# Patient Record
Sex: Male | Born: 1975 | Race: White | Hispanic: No | Marital: Single | Smoking: Never smoker
Health system: Southern US, Community
[De-identification: ages and names within clinical notes are randomized; demographics above are authoritative.]

## PROBLEM LIST (undated history)

## (undated) DIAGNOSIS — K5792 Diverticulitis of intestine, part unspecified, without perforation or abscess without bleeding: Secondary | ICD-10-CM

---

## 2017-05-06 ENCOUNTER — Other Ambulatory Visit: Payer: Self-pay

## 2017-05-06 ENCOUNTER — Emergency Department (HOSPITAL_COMMUNITY)
Admission: EM | Admit: 2017-05-06 | Discharge: 2017-05-07 | Disposition: A | Discharge status: Home / Self Care | Payer: Self-pay | Attending: Emergency Medicine | Admitting: Emergency Medicine

## 2017-05-06 ENCOUNTER — Encounter (HOSPITAL_COMMUNITY): Payer: Self-pay | Admitting: Emergency Medicine

## 2017-05-06 DIAGNOSIS — F419 Anxiety disorder, unspecified: Secondary | ICD-10-CM | POA: Insufficient documentation

## 2017-05-06 DIAGNOSIS — Z760 Encounter for issue of repeat prescription: Secondary | ICD-10-CM | POA: Insufficient documentation

## 2017-05-06 HISTORY — DX: Diverticulitis of intestine, part unspecified, without perforation or abscess without bleeding: K57.92

## 2017-05-06 NOTE — ED Notes (Signed)
Patient states he is feeling nauseated and anxious.

## 2017-05-06 NOTE — ED Triage Notes (Signed)
Pt states [place] recently [place] here from [place] and has not found a dr yet and has run out of his Ativan  Pt states he has been on it since 2006  Pt states he is having some chest tightness and does not feel well  Pt states he needs his ativan filled and needs a referral to a dr

## 2017-05-07 MED ORDER — LORAZEPAM 1 MG PO TABS: 1.0000 mg | ORAL_TABLET | Freq: Every day | ORAL | 0 refills | Status: DC | PRN

## 2017-05-07 NOTE — ED Provider Notes (Signed)
[facility]-EMERGENCY DEPT Provider Note   CSN: [id number] Arrival date & time: 05/06/17  2013     History   Chief Complaint Chief Complaint  Patient presents with  . Anxiety    HPI Thomas Fitzpatrick is a 42 y.o. male.  Patient presents with symptoms of intermittent heart palpitations, and anxiousness. He has been on Ativan regularly for anxiety for years and recently moved into the area without having established with a physician. He ran out of his medication 2-3 days ago. He feels nervous but denies nausea, vomiting. No HI/SI/AVH. He is requesting a medication refill.   The history is provided by the patient. No language interpreter was used.  Anxiety  Pertinent negatives include no shortness of breath.    Past Medical History:  Diagnosis Date  . Diverticulitis     There are no active problems to display for this patient.   History reviewed. No pertinent surgical history.     Home Medications    Prior to Admission medications   Not on File    Family History Family History  Problem Relation Age of Onset  . CAD Other     Social History Social History   Tobacco Use  . Smoking status: Never Smoker  . Smokeless tobacco: Never Used  Substance Use Topics  . Alcohol use: Yes    Comment: rare  . Drug use: No     Allergies   Penicillins   Review of Systems Review of Systems  Constitutional: Negative for chills and fever.  Respiratory: Negative.  Negative for cough and shortness of breath.   Cardiovascular: Positive for palpitations.  Gastrointestinal: Negative.  Negative for nausea and vomiting.  Musculoskeletal: Negative.   Skin: Negative.   Neurological: Negative.   Psychiatric/Behavioral: The patient is nervous/anxious.      Physical Exam Updated Vital Signs BP (!) 164/128 (BP Location: Right Arm)   Pulse (!) 111   Temp 97.8 F (36.6 C) (Oral)   Resp 18   Ht 6\' 2"  (1.88 m)   Wt 106.6 kg (235 lb)   SpO2 99%   BMI  30.17 kg/m   Physical Exam  Constitutional: He is oriented to person, place, and time. He appears well-developed and well-nourished.  HENT:  Head: Normocephalic.  Neck: Normal range of motion. Neck supple.  Cardiovascular: Normal rate and regular rhythm.  Pulmonary/Chest: Effort normal and breath sounds normal.  Abdominal: Soft. Bowel sounds are normal. There is no tenderness. There is no rebound and no guarding.  Musculoskeletal: Normal range of motion.  Neurological: He is alert and oriented to person, place, and time.  Skin: Skin is warm and dry. No rash noted.  Psychiatric: He has a normal mood and affect.     ED Treatments / Results  Labs (all labs ordered are listed, but only abnormal results are displayed) Labs Reviewed - No data to display  EKG  EKG Interpretation None       Radiology No results found.  Procedures Procedures (including critical care time)  Medications Ordered in ED Medications - No data to display   Initial Impression / Assessment and Plan / ED Course  I have reviewed the triage vital signs and the nursing notes.  Pertinent labs & imaging results that were available during my care of the patient were reviewed by me and considered in my medical decision making (see chart for details).     Patient here with request for medication refill. He has confirmation of prescription and  appears to take the meds as prescribed without overuse.   He is tachycardic raising concern for onset of withdrawal as he has been on Ativan for 'years' and has been out for 2-3 days. His current use is 1-2 pills (1 mg) daily prn.   Will Rx #10 only. He reports his insurance is now active and is seeking PCP for further management.    Final Clinical Impressions(s) / ED Diagnoses   Final diagnoses:  None   1. Anxiety 2. Medication refill  ED Discharge Orders    None       Elpidio Anis, PA-C 05/07/17 0018    Ward, Layla Maw, DO 05/07/17 [phone]

## 2017-05-07 NOTE — Discharge Instructions (Signed)
Follow up with a primary care provider for further management of anxiety.

## 2017-05-28 ENCOUNTER — Emergency Department (HOSPITAL_BASED_OUTPATIENT_CLINIC_OR_DEPARTMENT_OTHER): Payer: BLUE CROSS/BLUE SHIELD

## 2017-05-28 ENCOUNTER — Emergency Department (HOSPITAL_BASED_OUTPATIENT_CLINIC_OR_DEPARTMENT_OTHER)
Admission: EM | Admit: 2017-05-28 | Discharge: 2017-05-28 | Disposition: A | Discharge status: Home / Self Care | Payer: BLUE CROSS/BLUE SHIELD | Attending: Emergency Medicine | Admitting: Emergency Medicine

## 2017-05-28 ENCOUNTER — Other Ambulatory Visit: Payer: Self-pay

## 2017-05-28 ENCOUNTER — Encounter (HOSPITAL_BASED_OUTPATIENT_CLINIC_OR_DEPARTMENT_OTHER): Payer: Self-pay

## 2017-05-28 DIAGNOSIS — Y999 Unspecified external cause status: Secondary | ICD-10-CM | POA: Diagnosis not present

## 2017-05-28 DIAGNOSIS — Z79899 Other long term (current) drug therapy: Secondary | ICD-10-CM | POA: Diagnosis not present

## 2017-05-28 DIAGNOSIS — Y929 Unspecified place or not applicable: Secondary | ICD-10-CM | POA: Insufficient documentation

## 2017-05-28 DIAGNOSIS — Z23 Encounter for immunization: Secondary | ICD-10-CM | POA: Diagnosis not present

## 2017-05-28 DIAGNOSIS — S91312A Laceration without foreign body, left foot, initial encounter: Secondary | ICD-10-CM | POA: Diagnosis not present

## 2017-05-28 DIAGNOSIS — Y939 Activity, unspecified: Secondary | ICD-10-CM | POA: Diagnosis not present

## 2017-05-28 DIAGNOSIS — W25XXXA Contact with sharp glass, initial encounter: Secondary | ICD-10-CM | POA: Diagnosis not present

## 2017-05-28 MED ORDER — CEPHALEXIN 500 MG PO CAPS: 500.0000 mg | ORAL_CAPSULE | Freq: Three times a day (TID) | ORAL | 0 refills | Status: AC

## 2017-05-28 MED ORDER — CEPHALEXIN 250 MG PO CAPS
500.0000 mg | ORAL_CAPSULE | Freq: Once | ORAL | Status: AC
  Administered 2017-05-28: 500 mg via ORAL
  Filled 2017-05-28: qty 2

## 2017-05-28 MED ORDER — TETANUS-DIPHTH-ACELL PERTUSSIS 5-2.5-18.5 LF-MCG/0.5 IM SUSP
0.5000 mL | Freq: Once | INTRAMUSCULAR | Status: AC
  Administered 2017-05-28: 0.5 mL via INTRAMUSCULAR
  Filled 2017-05-28: qty 0.5

## 2017-05-28 NOTE — ED Provider Notes (Signed)
[facility] EMERGENCY DEPARTMENT Provider Note  CSN: [id number] Arrival date & time: 05/28/17 [id number]  Chief Complaint(s) Foot Injury  HPI Thomas Fitzpatrick is a 42 y.o. male with left foot pain after stepping on broken glass last night.  Patient states that he remove the shard of glass and was able to control the bleeding.  He did not washout the wound.  That he is endorsing persistent pain in that region.  Denies any falls or other trauma.  Denies any other injuries.  Unsure of last tetanus.  HPI  Past Medical History Past Medical History:  Diagnosis Date  . Diverticulitis    There are no active problems to display for this patient.  Home Medication(s) Prior to Admission medications   Medication Sig Start Date End Date Taking? Authorizing Provider  Amphetamine-Dextroamphetamine (ADDERALL PO) Take by mouth.   Yes [provider]  cephALEXin (KEFLEX) 500 MG capsule Take 1 capsule (500 mg total) by mouth 3 (three) times daily for 5 days. 05/28/17 06/02/17  Nira Connardama, Chales Pelissier Eduardo, MD  LORazepam (ATIVAN) 1 MG tablet Take 1 tablet (1 mg total) by mouth daily as needed for anxiety. 05/07/17   Elpidio AnisUpstill, Shari, PA-C                                                                                                                                    Past Surgical History History reviewed. No pertinent surgical history. Family History Family History  Problem Relation Age of Onset  . CAD Other     Social History Social History   Tobacco Use  . Smoking status: Never Smoker  . Smokeless tobacco: Never Used  Substance Use Topics  . Alcohol use: Yes    Comment: rare  . Drug use: No   Allergies Penicillins  Review of Systems Review of Systems  Skin: Positive for wound.    Physical Exam Vital Signs  I have reviewed the triage vital signs BP (!) 131/106 (BP Location: Left Arm)   Pulse (!) 127   Temp 98.7 F (37.1 C) (Oral)   Resp 18   Ht 6\' 2"  (1.88 m)   Wt 111 kg  (244 lb 11.4 oz)   SpO2 100%   BMI 31.42 kg/m   Physical Exam  Constitutional: He is oriented to person, place, and time. He appears well-developed and well-nourished. No distress.  HENT:  Head: Normocephalic and atraumatic.  Right Ear: External ear normal.  Left Ear: External ear normal.  Nose: Nose normal.  Mouth/Throat: Mucous membranes are normal. No trismus in the jaw.  Eyes: Conjunctivae and EOM are normal. No scleral icterus.  Neck: Normal range of motion and phonation normal.  Cardiovascular: Normal rate and regular rhythm.  Pulmonary/Chest: Effort normal. No stridor. No respiratory distress.  Abdominal: He exhibits no distension.  Musculoskeletal: Normal range of motion. He exhibits no edema.       Feet:  Neurological: He is  alert and oriented to person, place, and time.  Skin: He is not diaphoretic.  Psychiatric: He has a normal mood and affect. His behavior is normal.  Vitals reviewed.   ED Results and Treatments Labs (all labs ordered are listed, but only abnormal results are displayed) Labs Reviewed - No data to display                                                                                                                       EKG  EKG Interpretation  Date/Time:    Ventricular Rate:    PR Interval:    QRS Duration:   QT Interval:    QTC Calculation:   R Axis:     Text Interpretation:        Radiology Dg Foot Complete Left  Result Date: 05/28/2017 CLINICAL DATA:  Stepped on piece of glass yesterday. Plantar foot pain. Initial encounter. EXAM: LEFT FOOT - COMPLETE 3+ VIEW COMPARISON:  None. FINDINGS: There is no evidence of fracture or dislocation. There is no evidence of arthropathy or other focal bone abnormality. Soft tissues are unremarkable. No radiopaque foreign body identified. IMPRESSION: Negative. Electronically Signed   By: Myles Rosenthal M.D.   On: 05/28/2017 19:50   Pertinent labs & imaging results that were available during my care of  the patient were reviewed by me and considered in my medical decision making (see chart for details).  Medications Ordered in ED Medications  Tdap (BOOSTRIX) injection 0.5 mL (0.5 mLs Intramuscular Given 05/28/17 2032)  cephALEXin (KEFLEX) capsule 500 mg (500 mg Oral Given 05/28/17 2031)                                                                                                                                    Procedures Procedures  (including critical care time)  Medical Decision Making / ED Course I have reviewed the nursing notes for this encounter and the patient's prior records (if available in EHR or on provided paperwork).    Patient was able to provide a picture of the glass shard which appear to be completely intact.  Plain film without evidence of foreign body.  Wound was irrigated.  Tetanus was updated.  Will allow for secondary closure given the timeframe of the wound.  Will also provide the patient with prophylactic antibiotics.  Final Clinical Impression(s) / ED Diagnoses Final diagnoses:  Laceration of left foot, initial encounter  Disposition: Discharge  Condition: Good  I have discussed the results, Dx and Tx plan with the patient who expressed understanding and agree(s) with the plan. Discharge instructions discussed at great length. The patient was given strict return precautions who verbalized understanding of the instructions. No further questions at time of discharge.    ED Discharge Orders        Ordered    cephALEXin (KEFLEX) 500 MG capsule  3 times daily     05/28/17 2036       Follow Up: Primary care provider   If you do not have a primary care physician, contact [facility] at [phone] for referral     This chart was dictated using voice recognition software.  Despite best efforts to proofread,  errors can occur which can change the documentation meaning.   Nira Conn, MD 05/28/17 2037

## 2017-05-28 NOTE — ED Triage Notes (Addendum)
Pt states he stepped on broken glass last night-lac noted to bottom of left foot-NAD-steady gait

## 2017-06-27 ENCOUNTER — Other Ambulatory Visit: Payer: Self-pay

## 2017-06-27 ENCOUNTER — Emergency Department (HOSPITAL_COMMUNITY): Payer: BLUE CROSS/BLUE SHIELD

## 2017-06-27 ENCOUNTER — Observation Stay (HOSPITAL_COMMUNITY)
Admission: EM | Admit: 2017-06-27 | Discharge: 2017-06-28 | DRG: 897 | Disposition: A | Discharge status: Home / Self Care | Payer: BLUE CROSS/BLUE SHIELD | Attending: Family Medicine | Admitting: Family Medicine

## 2017-06-27 ENCOUNTER — Encounter (HOSPITAL_COMMUNITY): Payer: Self-pay

## 2017-06-27 DIAGNOSIS — K219 Gastro-esophageal reflux disease without esophagitis: Secondary | ICD-10-CM | POA: Diagnosis not present

## 2017-06-27 DIAGNOSIS — Z880 Allergy status to penicillin: Secondary | ICD-10-CM | POA: Diagnosis not present

## 2017-06-27 DIAGNOSIS — F10231 Alcohol dependence with withdrawal delirium: Secondary | ICD-10-CM | POA: Diagnosis present

## 2017-06-27 DIAGNOSIS — F10280 Alcohol dependence with alcohol-induced anxiety disorder: Secondary | ICD-10-CM | POA: Diagnosis not present

## 2017-06-27 DIAGNOSIS — M549 Dorsalgia, unspecified: Secondary | ICD-10-CM | POA: Diagnosis present

## 2017-06-27 DIAGNOSIS — F10239 Alcohol dependence with withdrawal, unspecified: Secondary | ICD-10-CM | POA: Diagnosis not present

## 2017-06-27 DIAGNOSIS — F10931 Alcohol use, unspecified with withdrawal delirium: Secondary | ICD-10-CM | POA: Diagnosis present

## 2017-06-27 DIAGNOSIS — M545 Low back pain: Secondary | ICD-10-CM | POA: Diagnosis not present

## 2017-06-27 DIAGNOSIS — F909 Attention-deficit hyperactivity disorder, unspecified type: Secondary | ICD-10-CM | POA: Diagnosis present

## 2017-06-27 DIAGNOSIS — I10 Essential (primary) hypertension: Secondary | ICD-10-CM | POA: Diagnosis not present

## 2017-06-27 DIAGNOSIS — R0789 Other chest pain: Secondary | ICD-10-CM | POA: Diagnosis present

## 2017-06-27 DIAGNOSIS — F10180 Alcohol abuse with alcohol-induced anxiety disorder: Secondary | ICD-10-CM

## 2017-06-27 DIAGNOSIS — R079 Chest pain, unspecified: Secondary | ICD-10-CM | POA: Diagnosis present

## 2017-06-27 DIAGNOSIS — G8929 Other chronic pain: Secondary | ICD-10-CM | POA: Diagnosis not present

## 2017-06-27 DIAGNOSIS — F10939 Alcohol use, unspecified with withdrawal, unspecified: Secondary | ICD-10-CM | POA: Diagnosis present

## 2017-06-27 LAB — CBC
HCT: 49.5 % (ref 39.0–52.0)
Hemoglobin: 17.3 g/dL — ABNORMAL HIGH (ref 13.0–17.0)
MCH: 31.2 pg (ref 26.0–34.0)
MCHC: 34.9 g/dL (ref 30.0–36.0)
MCV: 89.2 fL (ref 78.0–100.0)
Platelets: 374 10*3/uL (ref 150–400)
RBC: 5.55 MIL/uL (ref 4.22–5.81)
RDW: 12.5 % (ref 11.5–15.5)
WBC: 9.7 10*3/uL (ref 4.0–10.5)

## 2017-06-27 LAB — COMPREHENSIVE METABOLIC PANEL
ALBUMIN: 4.1 g/dL (ref 3.5–5.0)
ALT: 31 U/L (ref 17–63)
ANION GAP: 9 (ref 5–15)
AST: 24 U/L (ref 15–41)
Alkaline Phosphatase: 79 U/L (ref 38–126)
BUN: 16 mg/dL (ref 6–20)
CHLORIDE: 110 mmol/L (ref 101–111)
CO2: 23 mmol/L (ref 22–32)
Calcium: 9 mg/dL (ref 8.9–10.3)
Creatinine, Ser: 0.84 mg/dL (ref 0.61–1.24)
GFR calc non Af Amer: >60 mL/min (ref >60)
GLUCOSE: 126 mg/dL — AB (ref 65–99)
POTASSIUM: 4.1 mmol/L (ref 3.5–5.1)
SODIUM: 142 mmol/L (ref 135–145)
Total Bilirubin: 0.5 mg/dL (ref 0.3–1.2)
Total Protein: 7.5 g/dL (ref 6.5–8.1)

## 2017-06-27 LAB — I-STAT TROPONIN, ED: TROPONIN I, POC: 0 ng/mL (ref 0.00–0.08)

## 2017-06-27 LAB — RAPID URINE DRUG SCREEN, HOSP PERFORMED
AMPHETAMINES: NOT DETECTED
BENZODIAZEPINES: NOT DETECTED
Barbiturates: NOT DETECTED
Cocaine: NOT DETECTED
Opiates: NOT DETECTED
TETRAHYDROCANNABINOL: NOT DETECTED

## 2017-06-27 LAB — ETHANOL: Alcohol, Ethyl (B): <10 mg/dL (ref <10)

## 2017-06-27 MED ORDER — LORAZEPAM 1 MG PO TABS
2.0000 mg | ORAL_TABLET | Freq: Once | ORAL | Status: AC
  Administered 2017-06-27: 2 mg via ORAL
  Filled 2017-06-27: qty 2

## 2017-06-27 MED ORDER — LORAZEPAM 1 MG PO TABS: 0.0000 mg | ORAL_TABLET | Freq: Four times a day (QID) | ORAL | Status: DC

## 2017-06-27 MED ORDER — SODIUM CHLORIDE 0.9 % IV BOLUS (SEPSIS)
1000.0000 mL | Freq: Once | INTRAVENOUS | Status: AC
Start: 2017-06-27 — End: 2017-06-28
  Administered 2017-06-27: 1000 mL via INTRAVENOUS

## 2017-06-27 MED ORDER — LORAZEPAM 2 MG/ML IJ SOLN
1.0000 mg | Freq: Once | INTRAMUSCULAR | Status: AC
  Administered 2017-06-27: 1 mg via INTRAVENOUS
  Filled 2017-06-27: qty 1

## 2017-06-27 MED ORDER — LORAZEPAM 2 MG/ML IJ SOLN: 0.0000 mg | Freq: Two times a day (BID) | INTRAMUSCULAR | Status: DC

## 2017-06-27 MED ORDER — VITAMIN B-1 100 MG PO TABS: 100.0000 mg | ORAL_TABLET | Freq: Every day | ORAL | Status: DC

## 2017-06-27 MED ORDER — LORAZEPAM 1 MG PO TABS: 0.0000 mg | ORAL_TABLET | Freq: Two times a day (BID) | ORAL | Status: DC

## 2017-06-27 MED ORDER — LORAZEPAM 2 MG/ML IJ SOLN
0.0000 mg | Freq: Four times a day (QID) | INTRAMUSCULAR | Status: DC
  Administered 2017-06-28 (×2): 2 mg via INTRAVENOUS
  Filled 2017-06-27 (×2): qty 1

## 2017-06-27 MED ORDER — THIAMINE HCL 100 MG/ML IJ SOLN
100.0000 mg | Freq: Once | INTRAMUSCULAR | Status: AC
  Administered 2017-06-28: 100 mg via INTRAVENOUS
  Filled 2017-06-27: qty 2

## 2017-06-27 MED ORDER — THIAMINE HCL 100 MG/ML IJ SOLN: 100.0000 mg | Freq: Every day | INTRAMUSCULAR | Status: DC

## 2017-06-27 NOTE — ED Notes (Signed)
Pt c/o chest pain and alcohol withdrawal. He usually drinks 3 bottles of wine a day, and hasn't had anything in 24 hrs. He reports having a hx of anxiety, and his doctor placed him on xanax and ativan. He hasn't had either since he moved up here two months ago, and has been using alcohol since then.

## 2017-06-27 NOTE — ED Notes (Signed)
ED TO INPATIENT HANDOFF REPORT  Name/Age/Gender Thomas Fitzpatrick 42 y.o. male  Code Status   Home/SNF/Other Home  Chief Complaint Alcohol Withdrawl; Chest Pain  Level of Care/Admitting Diagnosis ED Disposition    ED Disposition Condition Comment   Admit  The patient appears reasonably stabilized for admission considering the current resources, flow, and capabilities available in the ED at this time, and I doubt any other [facility] requiring further screening and/or treatment in the ED prior to admission is  present.       Medical History Past Medical History:  Diagnosis Date  . Diverticulitis     Allergies Allergies  Allergen Reactions  . Penicillins     IV Location/Drains/Wounds Patient Lines/Drains/Airways Status   Active Line/Drains/Airways    Name:   Placement date:   Placement time:   Site:   Days:   Peripheral IV 06/27/17 Right Antecubital   06/27/17    2225    Antecubital   less than 1   Wound / Incision (Open or Dehisced) 05/28/17 Laceration Foot Left no active bleeding noted   05/28/17    2018    Foot   30          Labs/Imaging Results for orders placed or performed during the hospital encounter of 06/27/17 (from the past 48 hour(s))  Comprehensive metabolic panel     Status: Abnormal   Collection Time: 06/27/17  8:26 PM  Result Value Ref Range   Sodium 142 135 - 145 mmol/L   Potassium 4.1 3.5 - 5.1 mmol/L   Chloride 110 101 - 111 mmol/L   CO2 23 22 - 32 mmol/L   Glucose, Bld 126 (H) 65 - 99 mg/dL   BUN 16 6 - 20 mg/dL   Creatinine, Ser 0.84 0.61 - 1.24 mg/dL   Calcium 9.0 8.9 - 10.3 mg/dL   Total Protein 7.5 6.5 - 8.1 g/dL   Albumin 4.1 3.5 - 5.0 g/dL   AST 24 15 - 41 U/L   ALT 31 17 - 63 U/L   Alkaline Phosphatase 79 38 - 126 U/L   Total Bilirubin 0.5 0.3 - 1.2 mg/dL   GFR calc non Af Amer >60 >60 mL/min   GFR calc Af Amer >60 >60 mL/min    Comment: (NOTE) The eGFR has been calculated using the CKD EPI equation. This calculation has not been  validated in all clinical situations. eGFR's persistently <60 mL/min signify possible Chronic Kidney Disease.    Anion gap 9 5 - 15    Comment: Performed at [facility], [place] [address] [place], [place] [address]  Ethanol     Status: None   Collection Time: 06/27/17  8:26 PM  Result Value Ref Range   Alcohol, Ethyl (B) <10 <10 mg/dL    Comment:        LOWEST DETECTABLE LIMIT FOR SERUM ALCOHOL IS 10 mg/dL FOR MEDICAL PURPOSES ONLY Performed at [facility], [place] [address] [place], [place] [address]   cbc     Status: Abnormal   Collection Time: 06/27/17  8:26 PM  Result Value Ref Range   WBC 9.7 4.0 - 10.5 K/uL   RBC 5.55 4.22 - 5.81 MIL/uL   Hemoglobin 17.3 (H) 13.0 - 17.0 g/dL   HCT 49.5 39.0 - 52.0 %   MCV 89.2 78.0 - 100.0 fL   MCH 31.2 26.0 - 34.0 pg   MCHC 34.9 30.0 - 36.0 g/dL   RDW 12.5 11.5 - 15.5 %  Platelets 374 150 - 400 K/uL    Comment: Performed at [facility], [place] [address], [place], [place] [address]  Rapid urine drug screen (hospital performed)     Status: None   Collection Time: 06/27/17  9:58 PM  Result Value Ref Range   Opiates NONE DETECTED NONE DETECTED   Cocaine NONE DETECTED NONE DETECTED   Benzodiazepines NONE DETECTED NONE DETECTED   Amphetamines NONE DETECTED NONE DETECTED   Tetrahydrocannabinol NONE DETECTED NONE DETECTED   Barbiturates NONE DETECTED NONE DETECTED    Comment: (NOTE) DRUG SCREEN FOR MEDICAL PURPOSES ONLY.  IF CONFIRMATION IS NEEDED FOR ANY PURPOSE, NOTIFY LAB WITHIN 5 DAYS. LOWEST DETECTABLE LIMITS FOR URINE DRUG SCREEN Drug Class                     Cutoff (ng/mL) Amphetamine and metabolites    1000 Barbiturate and metabolites    200 Benzodiazepine                 975 Tricyclics and metabolites     300 Opiates and metabolites        300 Cocaine and metabolites        300 THC                            50 Performed at [facility], [place]  [address] [place], [place] [address]   I-stat troponin, ED     Status: None   Collection Time: 06/27/17 10:34 PM  Result Value Ref Range   Troponin i, poc 0.00 0.00 - 0.08 ng/mL   Comment 3            Comment: Due to the release kinetics of cTnI, a negative result within the first hours of the onset of symptoms does not rule out myocardial infarction with certainty. If myocardial infarction is still suspected, repeat the test at appropriate intervals.    No results found.  Pending Labs Unresulted Labs (From admission, onward)   None      Vitals/Pain Today's Vitals   06/27/17 1921 06/27/17 2010 06/27/17 2158 06/27/17 2230  BP: (!) 154/100  (!) 151/118 (!) 161/101  Pulse: (!) 143  (!) 122 (!) 121  Resp: 20  10 20   Temp: 97.9 F (36.6 C)     TempSrc: Oral     SpO2: 98%  99% 98%  Weight: 213 lb 14.4 oz (97 kg)     Height: 6' 2"  (1.88 m)     PainSc:  6       Isolation Precautions No active isolations  Medications Medications  thiamine (B-1) injection 100 mg (has no administration in time range)  LORazepam (ATIVAN) injection 0-4 mg (has no administration in time range)    Or  LORazepam (ATIVAN) tablet 0-4 mg (has no administration in time range)  LORazepam (ATIVAN) injection 0-4 mg (has no administration in time range)    Or  LORazepam (ATIVAN) tablet 0-4 mg (has no administration in time range)  thiamine (VITAMIN B-1) tablet 100 mg (has no administration in time range)    Or  thiamine (B-1) injection 100 mg (has no administration in time range)  LORazepam (ATIVAN) tablet 2 mg (2 mg Oral Given 06/27/17 2021)  sodium chloride 0.9 % bolus 1,000 mL (1,000 mLs Intravenous New Bag/Given 06/27/17 2224)  LORazepam (ATIVAN) injection 1 mg (1 mg Intravenous Given 06/27/17 2225)    Mobility walks

## 2017-06-27 NOTE — ED Provider Notes (Signed)
Emergency Department Provider Note   I have reviewed the triage vital signs and the nursing notes.   HISTORY  Chief Complaint Delirium Tremens (DTS)   HPI Thomas Fitzpatrick is a 42 y.o. male with PMH of anxiety and chronic benzo use presents to the ED with chest tightness, tachycardia, and and diaphoresis.  The [place] where he was unable to find a PCP who would refill his Ativan prescription.  He has started drinking very heavily over the past 2 months.  He reports drinking approximately 3 bottles of wine per night.  He tells me he was not a heavy drinker prior to coming off of the Ativan.  He states that he would take 1 mg of Ativan 3 times daily at baseline prior to 2 months ago.  He last drank 24 hours prior.  He developed tremors along with the above described chest discomfort and diaphoresis so presented to the emergency department.   Past Medical History:  Diagnosis Date  . Diverticulitis     Patient Active Problem List   Diagnosis Date Noted  . Alcohol withdrawal delirium, acute, hyperactive (HCC) 06/28/2017  . Chest pain 06/28/2017  . Alcohol withdrawal (HCC) 06/28/2017  . Back pain 06/28/2017    History reviewed. No pertinent surgical history.  Current Outpatient Rx  . Order #: [id number] Class: Historical Med  . Order #: [id number] Class: Historical Med  . Order #: [id number] Class: Historical Med  . Order #: [id number] Class: Print    Allergies Penicillins  Family History  Problem Relation Age of Onset  . CAD Other     Social History Social History   Tobacco Use  . Smoking status: Never Smoker  . Smokeless tobacco: Never Used  Substance Use Topics  . Alcohol use: Yes    Comment: daily  . Drug use: No    Review of Systems  Constitutional: No fever/chills. Positive diaphoresis and tremors.  Eyes: No visual changes. ENT: No sore throat. Cardiovascular: Positive chest pain. Respiratory: Denies shortness of  breath. Gastrointestinal: No abdominal pain. Positive nausea, no vomiting.  No diarrhea.  No constipation. Genitourinary: Negative for dysuria. Musculoskeletal: Negative for back pain. Skin: Negative for rash. Neurological: Negative for headaches, focal weakness or numbness.  10-point ROS otherwise negative.  ____________________________________________   PHYSICAL EXAM:  VITAL SIGNS: ED Triage Vitals  Enc Vitals Group     BP 06/27/17 1921 (!) 154/100     Pulse Rate 06/27/17 1921 (!) 143     Resp 06/27/17 1921 20     Temp 06/27/17 1921 97.9 F (36.6 C)     Temp Source 06/27/17 1921 Oral     SpO2 06/27/17 1921 98 %     Weight 06/27/17 1921 213 lb 14.4 oz (97 kg)     Height 06/27/17 1921 6\' 2"  (1.88 m)     Pain Score 06/27/17 2010 6   Constitutional: Alert and oriented. Appears uncomfortable but able to provide full history.  Eyes: Conjunctivae are normal.  Head: Atraumatic. Nose: No congestion/rhinnorhea. Mouth/Throat: Mucous membranes are dry.  Neck: No stridor.  Cardiovascular: Tachycardia. Good peripheral circulation. Grossly normal heart sounds.   Respiratory: Normal respiratory effort.  No retractions. Lungs CTAB. Gastrointestinal: Soft and nontender. No distention.  Musculoskeletal: No lower extremity tenderness nor edema. No gross deformities of extremities. Neurologic:  Normal speech and language. No gross focal neurologic deficits are appreciated.  Skin:  Skin is warm, dry and intact. No rash noted.  ____________________________________________   LABS (all labs  ordered are listed, but only abnormal results are displayed)  Labs Reviewed  COMPREHENSIVE METABOLIC PANEL - Abnormal; Notable for the following components:      Result Value   Glucose, Bld 126 (*)    All other components within normal limits  CBC - Abnormal; Notable for the following components:   Hemoglobin 17.3 (*)    All other components within normal limits  ETHANOL  RAPID URINE DRUG SCREEN,  HOSP PERFORMED  LIPASE, BLOOD  MAGNESIUM  PHOSPHORUS  I-STAT TROPONIN, ED   ____________________________________________  EKG   EKG Interpretation  Date/Time:  Friday June 27 2017 19:28:03 EDT Ventricular Rate:  144 PR Interval:  130 QRS Duration: 76 QT Interval:  276 QTC Calculation: 427 R Axis:   15 Text Interpretation:  Sinus tachycardia Anterior infarct , age undetermined Abnormal ECG No STEMI.  Confirmed by Alona Bene [id number]) on 06/27/2017 10:22:22 PM       ____________________________________________  RADIOLOGY  Dg Chest 2 View  Result Date: 06/27/2017 CLINICAL DATA:  Chest pain. EXAM: CHEST - 2 VIEW COMPARISON:  None. FINDINGS: The cardiomediastinal contours are normal. The lungs are clear. Pulmonary vasculature is normal. No consolidation, pleural effusion, or pneumothorax. No acute osseous abnormalities are seen. IMPRESSION: No acute pulmonary process. Electronically Signed   By: Rubye Oaks M.D.   On: 06/27/2017 22:50    ____________________________________________   PROCEDURES  Procedure(s) performed:   .Critical Care Performed by: Maia Plan, MD Authorized by: Maia Plan, MD   Critical care provider statement:    Critical care time (minutes):  35   Critical care time was exclusive of:  Separately billable procedures and treating other patients and teaching time   Critical care was necessary to treat or prevent imminent or life-threatening deterioration of the following conditions:  CNS failure or compromise   Critical care was time spent personally by me on the following activities:  Blood draw for specimens, development of treatment plan with patient or surrogate, evaluation of patient's response to treatment, examination of patient, obtaining history from patient or surrogate, ordering and performing treatments and interventions, ordering and review of laboratory studies, ordering and review of radiographic studies, pulse oximetry,  re-evaluation of patient's condition and review of old charts   I assumed direction of critical care for this patient from another provider in my specialty: no       ____________________________________________   INITIAL IMPRESSION / ASSESSMENT AND PLAN / ED COURSE  Pertinent labs & imaging results that were available during my care of the patient were reviewed by me and considered in my medical decision making (see chart for details).  Patient presents to the emergency department for evaluation of alcohol/benzodiazepine withdrawal.  He is tachycardic, hypertensive, and has significant subjective symptoms.  He was given 2 mg of oral Ativan prior to my evaluation and continues to have the symptoms.  I have ordered additional Ativan and CIWAt.  Plan to obtain chest x-ray, troponin, IV fluids. Patient is at risk for developing DTs.   Labs and CXR reviewed. Patient with no significant improvement after additional ativan. Plan for admit for additional enzyme trending, ativan, and withdrawal mgmt.   Discussed patient's case with Hospitalist, Dr. Adela Glimpse to request admission. Patient and family (if present) updated with plan. Care transferred to Hospitalist service.  I reviewed all nursing notes, vitals, pertinent old records, EKGs, labs, imaging (as available).  ____________________________________________  FINAL CLINICAL IMPRESSION(S) / ED DIAGNOSES  Final diagnoses:  Alcohol withdrawal syndrome with complication (HCC)  MEDICATIONS GIVEN DURING THIS VISIT:  Medications  LORazepam (ATIVAN) injection 0-4 mg (2 mg Intravenous Given 06/28/17 0107)    Or  LORazepam (ATIVAN) tablet 0-4 mg ( Oral See Alternative 06/28/17 0107)  LORazepam (ATIVAN) injection 0-4 mg (has no administration in time range)    Or  LORazepam (ATIVAN) tablet 0-4 mg (has no administration in time range)  thiamine (VITAMIN B-1) tablet 100 mg (has no administration in time range)    Or  thiamine (B-1) injection  100 mg (has no administration in time range)  sucralfate (CARAFATE) 1 GM/10ML suspension 1 g (has no administration in time range)  LORazepam (ATIVAN) tablet 2 mg (2 mg Oral Given 06/27/17 2021)  sodium chloride 0.9 % bolus 1,000 mL (1,000 mLs Intravenous New Bag/Given 06/27/17 2224)  LORazepam (ATIVAN) injection 1 mg (1 mg Intravenous Given 06/27/17 2225)  thiamine (B-1) injection 100 mg (100 mg Intravenous Given 06/28/17 0108)    Note:  This document was prepared using Dragon voice recognition software and may include unintentional dictation errors.  Alona Bene, MD Emergency Medicine    Evoleth Nordmeyer, Arlyss Repress, MD 06/28/17 [phone]

## 2017-06-27 NOTE — ED Triage Notes (Signed)
Pt moved here at the beginning of January and came to the ED and received xanax for alcohol detox until he found a doctor, the doctor he found doesn't prescribe xanax so he's been compensating by drinking wine at night Pt hasn't had a drink in 24 hours and is shaking, sweating and having chest pain

## 2017-06-28 ENCOUNTER — Encounter (HOSPITAL_COMMUNITY): Payer: Self-pay | Admitting: Internal Medicine

## 2017-06-28 DIAGNOSIS — F10931 Alcohol use, unspecified with withdrawal delirium: Secondary | ICD-10-CM | POA: Diagnosis present

## 2017-06-28 DIAGNOSIS — G8929 Other chronic pain: Secondary | ICD-10-CM | POA: Diagnosis not present

## 2017-06-28 DIAGNOSIS — F10239 Alcohol dependence with withdrawal, unspecified: Secondary | ICD-10-CM | POA: Diagnosis not present

## 2017-06-28 DIAGNOSIS — M549 Dorsalgia, unspecified: Secondary | ICD-10-CM | POA: Diagnosis present

## 2017-06-28 DIAGNOSIS — M545 Low back pain: Secondary | ICD-10-CM

## 2017-06-28 DIAGNOSIS — F10231 Alcohol dependence with withdrawal delirium: Secondary | ICD-10-CM

## 2017-06-28 DIAGNOSIS — F10180 Alcohol abuse with alcohol-induced anxiety disorder: Secondary | ICD-10-CM | POA: Diagnosis not present

## 2017-06-28 DIAGNOSIS — F10939 Alcohol use, unspecified with withdrawal, unspecified: Secondary | ICD-10-CM | POA: Diagnosis present

## 2017-06-28 DIAGNOSIS — R079 Chest pain, unspecified: Secondary | ICD-10-CM | POA: Diagnosis not present

## 2017-06-28 LAB — PHOSPHORUS: Phosphorus: 3.1 mg/dL (ref 2.5–4.6)

## 2017-06-28 LAB — LIPASE, BLOOD: Lipase: 29 U/L (ref 11–51)

## 2017-06-28 LAB — MAGNESIUM: MAGNESIUM: 2.1 mg/dL (ref 1.7–2.4)

## 2017-06-28 MED ORDER — SUCRALFATE 1 GM/10ML PO SUSP
1.0000 g | Freq: Three times a day (TID) | ORAL | Status: DC
  Administered 2017-06-28: 1 g via ORAL
  Filled 2017-06-28: qty 10

## 2017-06-28 MED ORDER — RANITIDINE HCL 300 MG PO TABS: 300.0000 mg | ORAL_TABLET | Freq: Every day | ORAL | 1 refills | Status: DC

## 2017-06-28 MED ORDER — AMLODIPINE BESYLATE 5 MG PO TABS: 5.0000 mg | ORAL_TABLET | Freq: Every day | ORAL | 1 refills | Status: DC

## 2017-06-28 MED ORDER — ZOLPIDEM TARTRATE 5 MG PO TABS
5.0000 mg | ORAL_TABLET | Freq: Once | ORAL | Status: AC
  Administered 2017-06-28: 5 mg via ORAL
  Filled 2017-06-28: qty 1

## 2017-06-28 MED ORDER — AMLODIPINE BESYLATE 2.5 MG PO TABS: 2.5000 mg | ORAL_TABLET | Freq: Every day | ORAL | 1 refills | Status: AC

## 2017-06-28 MED ORDER — SUCRALFATE 1 GM/10ML PO SUSP: 1.0000 g | Freq: Three times a day (TID) | ORAL | 0 refills | Status: AC

## 2017-06-28 MED ORDER — TRAZODONE HCL 100 MG PO TABS: 100.0000 mg | ORAL_TABLET | Freq: Every day | ORAL | 1 refills | Status: DC

## 2017-06-28 MED ORDER — AMPHETAMINE-DEXTROAMPHETAMINE 10 MG PO TABS: 15.0000 mg | ORAL_TABLET | Freq: Every day | ORAL | Status: DC

## 2017-06-28 MED ORDER — TRAZODONE HCL 100 MG PO TABS: 100.0000 mg | ORAL_TABLET | Freq: Every day | ORAL | Status: DC

## 2017-06-28 MED ORDER — GABAPENTIN 300 MG PO CAPS
300.0000 mg | ORAL_CAPSULE | Freq: Three times a day (TID) | ORAL | Status: DC
  Administered 2017-06-28: 300 mg via ORAL
  Filled 2017-06-28: qty 1

## 2017-06-28 MED ORDER — RANITIDINE HCL 300 MG PO TABS: 300.0000 mg | ORAL_TABLET | Freq: Every day | ORAL | 1 refills | Status: AC

## 2017-06-28 MED ORDER — GABAPENTIN 300 MG PO CAPS: 300.0000 mg | ORAL_CAPSULE | Freq: Three times a day (TID) | ORAL | 0 refills | Status: AC

## 2017-06-28 MED ORDER — HYDROXYZINE HCL 25 MG PO TABS
25.0000 mg | ORAL_TABLET | Freq: Once | ORAL | Status: AC
  Administered 2017-06-28: 25 mg via ORAL
  Filled 2017-06-28: qty 1

## 2017-06-28 MED ORDER — TRAZODONE HCL 100 MG PO TABS: 100.0000 mg | ORAL_TABLET | Freq: Every day | ORAL | 1 refills | Status: AC

## 2017-06-28 NOTE — Discharge Instructions (Signed)
Substance Use Disorder Substance use disorder happens when a person's repeated use of drugs or alcohol interferes with his or her ability to be productive. This disorder can cause problems with your mental and physical health. It can affect your ability to have healthy relationships, and it can keep you from being able to meet your responsibilities at work, home, or school. It can also lead to addiction. The most commonly abused substances include:  Alcohol.  Tobacco.  Marijuana.  Stimulants, such as cocaine and methamphetamine.  Hallucinogens, such as LSD and PCP.  Opioids, such as some prescription pain medicines and heroin.  What are the causes? This condition may develop due to social, psychological, or physical reasons. Causes include:  Stress.  Abuse.  Peer pressure.  Anxiety.  Depression.  What increases the risk? This condition is more likely to develop in people who:  Are stressed.  Have been abused.  Have a mental health disorder, such as depression.  Are born with certain genes.  What are the signs or symptoms? Symptoms of this condition include:  Using the substance for longer periods of time or at a higher dosage than what is normal or intended.  Having a lasting desire to use the substance.  Being unable to slow down or stop your use of the substance.  Spending an abnormal amount of time seeking the substance, using the substance, or recovering from using the substance.  Craving the substance.  Substance use that: ? Interferes with your work, school, or home life. ? Interferes with your personal and social relationships. ? Makes you give up activities that you once enjoyed or found important.  Using the substance even though: ? You know it is dangerous or bad for your health. ? You know it is causing problems in your life.  Needing more and more of the substance to get the same effect (developing tolerance).  Experiencing physical symptoms  if you do not use the substance (withdrawal).  Using the substance to avoid withdrawal.  How is this diagnosed? This condition may be diagnosed based on:  Your history of substance use.  The way in which substance abuse affects your life.  Having at least two symptoms of substance use disorder within a 5017-month period.  Your health care provider may also test your blood and urine for alcohol and drugs. How is this treated? This condition may be treated by:  Stopping substance use safely. This may require taking medicines and being closely observed for several days.  Taking part in group and individual counseling from mental health providers who help people with substance use disorder.  Staying at a residential treatment center for several days or weeks.  Attending daily counseling sessions at a treatment center.  Taking medicine as told by your health care provider: ? To ease symptoms and prevent complications during withdrawal. ? To treat other mental health issues, such as depression or anxiety. ? To block cravings by causing the same effects as the substance. ? To block the effects of the substance or replace good sensations with unpleasant ones.  Going to a support group to share your experience with others who are going through the same thing. These groups are an important part of long-term recovery for many people. They include 12-step programs like Alcoholics Anonymous and Narcotics Anonymous.  Recovery can be a long process. Many people who undergo treatment start using the substance again after stopping. This is called a relapse. If you have a relapse, that does not mean  that treatment will not work. Follow these instructions at home:  Take over-the-counter and prescription medicines only as told by your health care provider.  Do not use any drugs or alcohol.  Keep all follow-up visits as told by your health care provider. This is important. This includes continuing  to work with therapists, health care providers, and support groups. Contact a health care provider if:  You cannot take your medicines as told.  Your symptoms get worse.  You have trouble resisting the urge to use drugs or alcohol. Get help right away if:  You have serious thoughts about hurting yourself or someone else.  You have a relapse. This information is not intended to replace advice given to you by your health care provider. Make sure you discuss any questions you have with your health care provider. Document Released: 11/14/2004 Document Revised: 11/22/2015 Document Reviewed: 03/30/2014 Elsevier Interactive Patient Education  [employer]2018 Elsevier Inc[employer]

## 2017-06-28 NOTE — [facility] Suicide Risk Assessment (Signed)
Suicide Risk Assessment  Discharge Assessment   [facility]   Principal Problem: Alcohol abuse with alcohol-induced anxiety disorder [facility](HCC) Discharge Diagnoses:  Patient Active Problem List   Diagnosis Date Noted  . Alcohol abuse with alcohol-induced anxiety disorder (HCC) [F10.180] 06/28/2017    Priority: High  . Chest pain [R07.9] 06/28/2017  . Back pain [M54.9] 06/28/2017    Total Time spent with patient: 45 minutes  Musculoskeletal: Strength & Muscle Tone: within normal limits Gait & Station: normal Patient leans: N/A  Psychiatric Specialty Exam: Physical Exam  Psychiatric: He has a normal mood and affect. His speech is normal and behavior is normal. Judgment and thought content normal. Cognition and memory are normal.    Review of Systems  Constitutional: Negative.   HENT: Negative.   Eyes: Negative.   Respiratory: Negative.   Cardiovascular: Negative.   Gastrointestinal: Negative.   Genitourinary: Negative.   Musculoskeletal: Negative.   Skin: Negative.   Neurological: Negative.   Endo/Heme/Allergies: Negative.   Psychiatric/Behavioral: Positive for substance abuse.    Blood pressure (!) 126/92, pulse 96, temperature 97.9 F (36.6 C), temperature source Oral, resp. rate 20, height 6\' 2"  (1.88 m), weight 97 kg (213 lb 14.4 oz), SpO2 98 %.Body mass index is 27.46 kg/m.  General Appearance: Casual  Eye Contact:  Good  Speech:  Clear and Coherent  Volume:  Normal  Mood:  Euthymic  Affect:  Appropriate  Thought Process:  Coherent and Descriptions of Associations: Intact  Orientation:  Full (Time, Place, and Person)  Thought Content:  Logical  Suicidal Thoughts:  No  Homicidal Thoughts:  No  Memory:  Immediate;   Good Recent;   Good Remote;   Good  Judgement:  Intact  Insight:  Fair  Psychomotor Activity:  Normal  Concentration:  Concentration: Good and Attention Span: Good  Recall:  Good  Fund of Knowledge:  Good  Language:   Good  Akathisia:  No  Handed:  Right  AIMS (if indicated):     Assets:  Communication Skills  ADL's:  Intact  Cognition:  WNL  Sleep:   fair    Mental Status Per Nursing Assessment::   On Admission:   chest pain  Demographic Factors:  Male and Caucasian  Loss Factors: NA  Historical Factors: NA  Risk Reduction Factors:   Sense of responsibility to family, Employed and Positive social support  Continued Clinical Symptoms:  None   Cognitive Features That Contribute To Risk:  None    Suicide Risk:  Minimal: No identifiable suicidal ideation.  Patients presenting with no risk factors but with morbid ruminations; may be classified as minimal risk based on the severity of the depressive symptoms    Plan Of Care/Follow-up recommendations:  Activity:  as tolerated Diet:  heart healthy diet  Jalexa Pifer, NP 06/28/2017, 12:19 PM

## 2017-06-28 NOTE — ED Notes (Addendum)
Dr Mariea ClontsEmokpae paged to notify of pt status with meds and CIWA

## 2017-06-28 NOTE — Discharge Summary (Signed)
Thomas Fitzpatrick, is a 42 y.o. male  DOB 01/15/1976  MRN [id number].  Admission date:  06/27/2017  Admitting Physician  Therisa Doyne, MD  Discharge Date:  06/28/2017   Primary MD  Patient, No Pcp Per  Recommendations for primary care physician for things to follow:   1) follow discharge instructions as advised by her psychiatrist Dr. Jannifer Franklin 2) call or return or follow-up with PCP if recurrent chest pains 3) follow-up with PCP for BP recheck in 1-2 weeks   Admission Diagnosis  Alcohol Withdrawl; Chest Pain   Discharge Diagnosis  Alcohol Withdrawl; Chest Pain    Principal Problem:   Alcohol abuse with alcohol-induced anxiety disorder [facility]) Active Problems:   Chest pain   Back pain      Past Medical History:  Diagnosis Date  . Diverticulitis     History reviewed. No pertinent surgical history.     HPI  from the history and physical done on the day of admission:     HPI: Thomas Fitzpatrick is a 42 y.o. male with medical history significant of anexiety, ADHD   Presented with nausea vomiting and shaking She has been on Ativan 1 mg 3 times a day for years and recently moved from out of state to [place] where he lost his prescription and have not been able to establish a physician who would prescribe him Ativan. In end of January he was seen in emergency department for anxiety and was given a prescription for 10 tablets of Ativan and hope for him to find a PCP unfortunately he was not able to.  Instead patient started to drink alcohol to try to combat withdrawal from Ativan he reports drinking up to 3 bottles of wine a day at this point he is interested in quitting but when he try to stop drinking alcohol he started to develop shaking tremors.  Last alcohol drink was over 24 hours ago Also endorsing chest pain endorses diaphoresis. Describes as tightness associated with shortness of  breath sometimes fluttering. Better with Ativan.   Chest pain worse when he is thinking about it. Reports father had first MI at 53 and then in 81's.  Reports not taking his Adderal Reports insomnia No Hx of CAD or HTN, no hx of DM Reports hx of anxiety and panic attacks. Never seen a psychiatrist.  Reports hx of back pain with severe spasms. Hx of arthritis.   While in ER: Despite repeated doses of Ativan continue to be tachycardic and hypertensive with evidence of withdrawal   Hospital Course:     1)Atypical CP-initial troponin and EKG without acute concerns,, patient has remained chest pain-free for several hours  2)GERD-may use Carafate and ranitidine as prescribed, dietary modifications discussed  3)Anxiety/Etoh Abuse-please see full psychiatric consultation, patient is fixated on getting prescriptions for benzos, repeatedly asking for Librium or Ativan.  Psychiatric consult appreciated.  Outpatient follow-up with psychiatry strongly advised may use trazodone nightly for sleep gabapentin as prescribed by psychiatrist Dr. Jannifer Franklin  4)HTN-BP remains elevated, patient  advised to adhere to low-salt diet, may start amlodipine 2.5 mg daily, follow-up with PCP for BP recheck in 1-2 weeks   Discharge Condition: stable, no tachycardia, no evidence of significant withdrawal symptoms  Follow UP-- with PCP and Psychiatrist   Consults obtained - -psychiatrist Dr. Jannifer FranklinAkintayo  Diet and Activity recommendation:  As advised  Discharge Instructions      Discharge Instructions    Diet - low sodium heart healthy   Complete by:  As directed    Discharge instructions   Complete by:  As directed    Discharge home   Increase activity slowly   Complete by:  As directed        Discharge Medications     Allergies as of 06/28/2017      Reactions   Penicillins       Medication List    STOP taking these medications   acetaminophen 500 MG tablet Commonly known as:  TYLENOL   ADDERALL  15 MG tablet Generic drug:  amphetamine-dextroamphetamine   LORazepam 1 MG tablet Commonly known as:  ATIVAN     TAKE these medications   amLODipine 2.5 MG tablet Commonly known as:  NORVASC Take 1 tablet (2.5 mg total) by mouth daily.   gabapentin 300 MG capsule Commonly known as:  NEURONTIN Take 1 capsule (300 mg total) by mouth 3 (three) times daily.   ranitidine 300 MG tablet Commonly known as:  ZANTAC Take 1 tablet (300 mg total) by mouth at bedtime. What changed:    medication strength  how much to take   sucralfate 1 GM/10ML suspension Commonly known as:  CARAFATE Take 10 mLs (1 g total) by mouth 4 (four) times daily -  with meals and at bedtime.   traZODone 100 MG tablet Commonly known as:  DESYREL Take 1 tablet (100 mg total) by mouth at bedtime.       Major procedures and Radiology Reports - PLEASE review detailed and final reports for all details, in brief -    Dg Chest 2 View  Result Date: 06/27/2017 CLINICAL DATA:  Chest pain. EXAM: CHEST - 2 VIEW COMPARISON:  None. FINDINGS: The cardiomediastinal contours are normal. The lungs are clear. Pulmonary vasculature is normal. No consolidation, pleural effusion, or pneumothorax. No acute osseous abnormalities are seen. IMPRESSION: No acute pulmonary process. Electronically Signed   By: Rubye OaksMelanie  Ehinger M.D.   On: 06/27/2017 22:50    Micro Results   No results found for this or any previous visit (from the past 240 hour(s)).  Today   Subjective    The KrogerDavid Fitzpatrick today has no new complaints, patient is chest pain-free, no shortness of breath, no dyspnea on exertion, no palpitations no dizziness ambulating around the room        Patient admits to some anxiety symptoms, denies headaches,   Patient has been seen and examined prior to discharge   Objective   Blood pressure (!) 153/76, pulse (!) 104, temperature 97.9 F (36.6 C), temperature source Oral, resp. rate 20, height 6\' 2"  (1.88 m), weight 97 kg  (213 lb 14.4 oz), SpO2 95 %.  No intake or output data in the 24 hours ending 06/28/17 1342  Exam Gen:- Awake Alert,  In no apparent distress  HEENT:- [place].AT, No sclera icterus Neck-Supple Neck,No JVD,.  Lungs-  CTAB , good air movement CV- S1, S2 normal Abd-  +ve B.Sounds, Abd Soft, No tenderness,    Extremity/Skin:- No  edema,   warm and dry Psych-affect is  appropriate, oriented x3 Neuro-no new focal deficits, no significant tremors   Data Review   CBC w Diff:  Lab Results  Component Value Date   WBC 9.7 06/27/2017   HGB 17.3 (H) 06/27/2017   HCT 49.5 06/27/2017   PLT 374 06/27/2017    CMP:  Lab Results  Component Value Date   NA 142 06/27/2017   K 4.1 06/27/2017   CL 110 06/27/2017   CO2 23 06/27/2017   BUN 16 06/27/2017   CREATININE 0.84 06/27/2017   PROT 7.5 06/27/2017   ALBUMIN 4.1 06/27/2017   BILITOT 0.5 06/27/2017   ALKPHOS 79 06/27/2017   AST 24 06/27/2017   ALT 31 06/27/2017    Total Discharge time is about 33 minutes  Shon Hale M.D on 06/28/2017 at 1:42 PM  [facility]   Office  [phone]  Voice Recognition Reubin Milan dictation system was used to create this note, attempts have been made to correct errors. Please contact the author with questions and/or clarifications.

## 2017-06-28 NOTE — H&P (Signed)
Thomas Fitzpatrick RUE:[id number] DOB: June 18, 1975 DOA: 06/27/2017     PCP: Patient, No Pcp Per  Tried to see someone but unsure who  Outpatient Specialists:   NONE      Patient arrived to ER on 06/27/17 at 1910  Patient coming from:    home Lives alone,      Chief Complaint:  Chief Complaint  Patient presents with  . Delirium Tremens (DTS)    HPI: Thomas Fitzpatrick is a 42 y.o. male with medical history significant of anexiety, ADHD    Presented with nausea vomiting and shaking She has been on Ativan 1 mg 3 times a day for years and recently moved from out of state to [place] where he lost his prescription and have not been able to establish a physician who would prescribe him Ativan. In end of January he was seen in emergency department for anxiety and was given a prescription for 10 tablets of Ativan and hope for him to find a PCP unfortunately he was not able to.  Instead patient started to drink alcohol to try to combat withdrawal from Ativan he reports drinking up to 3 bottles of wine a day at this point he is interested in quitting but when he try to stop drinking alcohol he started to develop shaking tremors.  Last alcohol drink was over 24 hours ago Also endorsing chest pain endorses diaphoresis. Describes as tightness associated with shortness of breath sometimes fluttering. Better with Ativan.   Chest pain worse when he is thinking about it. Reports father had first MI at 57 and then in 45's.  Reports not taking his Adderal Reports insomnia No Hx of CAD or HTN, no hx of DM Reports hx of anxiety and panic attacks. Never seen a psychiatrist.  Reports hx of back pain with severe spasms. Hx of arthritis.   While in ER: Despite repeated doses of Ativan continue to be tachycardic and hypertensive with evidence of withdrawal  Significant initial  Findings: Abnormal Labs Reviewed  COMPREHENSIVE METABOLIC PANEL - Abnormal; Notable for the following components:   Result Value   Glucose, Bld 126 (*)    All other components within normal limits  CBC - Abnormal; Notable for the following components:   Hemoglobin 17.3 (*)    All other components within normal limits   Na 142 K 4.1  Cr    Lab Results  Component Value Date   CREATININE 0.84 06/27/2017    WBC 9.7  HG/HCT     Component Value Date/Time   HGB 17.3 (H) 06/27/2017 2026   HCT 49.5 06/27/2017 2026     Troponin (Point of Care Test) Recent Labs    06/27/17 2234  TROPIPOC 0.00      UA  not ordered   CXR - NON acute   ECG:  Personally reviewed by me showing: HR : 144 Rhythm: Sinus tachycardia Ischemic changes no evidence of ischemic changes QTC 427    ED Triage Vitals  Enc Vitals Group     BP 06/27/17 1921 (!) 154/100     Pulse Rate 06/27/17 1921 (!) 143     Resp 06/27/17 1921 20     Temp 06/27/17 1921 97.9 F (36.6 C)     Temp Source 06/27/17 1921 Oral     SpO2 06/27/17 1921 98 %     Weight 06/27/17 1921 213 lb 14.4 oz (97 kg)     Height 06/27/17 1921 6\' 2"  (1.88 m)  Head Circumference --      Peak Flow --      Pain Score 06/27/17 2010 6     Pain Loc --      Pain Edu? --      Excl. in GC? --   TMAX(24)@     on arrival  ED Triage Vitals  Enc Vitals Group     BP 06/27/17 1921 (!) 154/100     Pulse Rate 06/27/17 1921 (!) 143     Resp 06/27/17 1921 20     Temp 06/27/17 1921 97.9 F (36.6 C)     Temp Source 06/27/17 1921 Oral     SpO2 06/27/17 1921 98 %     Weight 06/27/17 1921 213 lb 14.4 oz (97 kg)     Height 06/27/17 1921 6\' 2"  (1.88 m)     Head Circumference --      Peak Flow --      Pain Score 06/27/17 2010 6     Pain Loc --      Pain Edu? --      Excl. in GC? --      Latest  Blood pressure (!) 132/102, pulse (!) 128, temperature 97.9 F (36.6 C), temperature source Oral, resp. rate (!) 24, height 6\' 2"  (1.88 m), weight 97 kg (213 lb 14.4 oz), SpO2 99 %.   Following Medications were ordered in ER: Medications  thiamine (B-1) injection  100 mg (has no administration in time range)  LORazepam (ATIVAN) injection 0-4 mg (0 mg Intravenous Not Given 06/27/17 2300)    Or  LORazepam (ATIVAN) tablet 0-4 mg ( Oral See Alternative 06/27/17 2300)  LORazepam (ATIVAN) injection 0-4 mg (has no administration in time range)    Or  LORazepam (ATIVAN) tablet 0-4 mg (has no administration in time range)  thiamine (VITAMIN B-1) tablet 100 mg (has no administration in time range)    Or  thiamine (B-1) injection 100 mg (has no administration in time range)  LORazepam (ATIVAN) tablet 2 mg (2 mg Oral Given 06/27/17 2021)  sodium chloride 0.9 % bolus 1,000 mL (1,000 mLs Intravenous New Bag/Given 06/27/17 2224)  LORazepam (ATIVAN) injection 1 mg (1 mg Intravenous Given 06/27/17 2225)      Hospitalist was called for admission for alcohol withdrawal symptoms high risk for delirium tremens and evaluation for chest pain    Review of Systems:    Pertinent positives include:  abdominal pain, nausea, vomiting, chest pain,   Constitutional:  No weight loss, night sweats, Fevers, chills, fatigue, weight loss  HEENT:  No headaches, Difficulty swallowing,Tooth/dental problems,Sore throat,  No sneezing, itching, ear ache, nasal congestion, post nasal drip,  Cardio-vascular:  NoOrthopnea, PND, anasarca, dizziness, palpitations.no Bilateral lower extremity swelling  GI:  No heartburn, indigestion,  diarrhea, change in bowel habits, loss of appetite, melena, blood in stool, hematemesis Resp:  no shortness of breath at rest. No dyspnea on exertion, No excess mucus, no productive cough, No non-productive cough, No coughing up of blood.No change in color of mucus.No wheezing. Skin:  no rash or lesions. No jaundice GU:  no dysuria, change in color of urine, no urgency or frequency. No straining to urinate.  No flank pain.  Musculoskeletal:  No joint pain or no joint swelling. No decreased range of motion. No back pain.  Psych:  No change in mood or affect.  No depression or anxiety. No memory loss.  Neuro: no localizing neurological complaints, no tingling, no weakness, no double vision, no gait abnormality,  no slurred speech, no confusion  As per HPI otherwise 10 point review of systems negative.   Past Medical History:  Past Medical History:  Diagnosis Date  . Diverticulitis        History reviewed. No pertinent surgical history.  Social History:  Ambulatory   Independently     reports that he has never smoked. He has never used smokeless tobacco. He reports that he drinks alcohol. He reports that he does not use drugs.     Family History:   Family History  Problem Relation Age of Onset  . CAD Other     Allergies: Allergies  Allergen Reactions  . Penicillins      Prior to Admission medications   Medication Sig Start Date End Date Taking? Authorizing Provider  acetaminophen (TYLENOL) 500 MG tablet Take 1,000-1,500 mg by mouth 3 (three) times daily as needed (back pain/arthritis).   Yes [provider]  amphetamine-dextroamphetamine (ADDERALL) 15 MG tablet Take 15 mg by mouth 2 (two) times daily.    Yes [provider]  ranitidine (ZANTAC) 75 MG tablet Take 75 mg by mouth at bedtime.   Yes [provider]  LORazepam (ATIVAN) 1 MG tablet Take 1 tablet (1 mg total) by mouth daily as needed for anxiety. Patient not taking: Reported on 06/27/2017 05/07/17   Elpidio Anis, PA-C   Physical Exam: Blood pressure (!) 132/102, pulse (!) 128, temperature 97.9 F (36.6 C), temperature source Oral, resp. rate (!) 24, height 6\' 2"  (1.88 m), weight 97 kg (213 lb 14.4 oz), SpO2 99 %.  1. General:  in No Acute distress  well -appearing 2. Psychological: Alert and   Oriented 3. Head/ENT:   Dry Mucous Membranes                          Head Non traumatic, neck supple                           Poor Dentition 4. SKIN:   decreased Skin turgor,  Skin clean Dry and intact no rash 5. Heart: Rapid regular rate and  rhythm no  Murmur, no Rub or gallop 6. Lungs:  Clear to auscultation bilaterally, no wheezes or crackles   7. Abdomen: Soft,  non-tender, Non distended   obese  bowel sounds present 8. Lower extremities: no clubbing, cyanosis, or edema 9. Neurologically Grossly intact, moving all 4 extremities equally tremulous 10. MSK: Normal range of motion   LABS:     Recent Labs  Lab 06/27/17 2026  WBC 9.7  HGB 17.3*  HCT 49.5  MCV 89.2  PLT 374   Basic Metabolic Panel: Recent Labs  Lab 06/27/17 2026  NA 142  K 4.1  CL 110  CO2 23  GLUCOSE 126*  BUN 16  CREATININE 0.84  CALCIUM 9.0        Radiological Exams on Admission: Dg Chest 2 View  Result Date: 06/27/2017 CLINICAL DATA:  Chest pain. EXAM: CHEST - 2 VIEW COMPARISON:  None. FINDINGS: The cardiomediastinal contours are normal. The lungs are clear. Pulmonary vasculature is normal. No consolidation, pleural effusion, or pneumothorax. No acute osseous abnormalities are seen. IMPRESSION: No acute pulmonary process. Electronically Signed   By: Rubye Oaks M.D.   On: 06/27/2017 22:50    Chart has been reviewed    Assessment/Plan   42 y.o. male with medical history significant of anexiety, ADHD   Admitted for  alcohol withdrawal and chest pain   Present on Admission: . Alcohol withdrawal (HCC) - ORDER CIWA given hemodynamic instability admit to stepdown . Chest pain - - H=  1 ,  E=0 ,   A=0 , R   2 , T 0 ,  for the  Total of 3 therefore will admit for observation and further evaluation ( Risk of MACE: Scores 0-3  of 0.9-1.7%.,  4-6: 12-16.6% , Scores ?7: 50-65% )   -  Other explanation for chest pain could be GI related even recurrent vomiting versus musculoskeletal   - monitor on telemetry, cycle cardiac enzymes, obtain serial ECG and  ECHO in AM.    -  Further risk stratify with lipid panel, hgA1C, obtain TSH.  Make sure patient is on Aspirin.  We will notify cardiology regarding patient's admission. Further  management depends on pending  workup  Back pain chronic will obtain lumbar spine plain imaging Attention deficit disorder Adderall on hold while tachycardic  Other plan as per orders.  DVT prophylaxis:  SCD    Code Status:  FULL CODE    Family Communication:   Family not  at  Bedside    Disposition Plan:       To home once workup is complete and patient is stable                      Social Work   consulted                          Consults called: behavioral health consulted, notified E link patient has been admitted  Admission status:  inpatient      Level of care  SDU      I have spent a total of 56 min on this admission  Laurabelle Gorczyca 06/28/2017, 12:59 AM    [facility]  Pager [phone]   after 2 AM please page floor coverage PA If 7AM-7PM, please contact the day team taking care of the patient  [email or web address]  Password [email or web address]

## 2017-06-28 NOTE — Patient Outreach (Signed)
ED Peer Support Specialist Patient Intake (Complete at intake & 30-60 Day Follow-up)  Name: Thomas ClineDavid Lee Fitzpatrick  MRN: [id number]  Age: 42 y.o.   Date of Admission: 06/28/2017  Intake: Initial Comments:      Primary Reason Admitted: Alcohol withdrawal   Lab values: Alcohol/ETOH: Positive Positive UDS? No Amphetamines: No Barbiturates: No Benzodiazepines: No Cocaine: No Opiates: No Cannabinoids: No  Demographic information: Gender: Male Ethnicity: White Marital Status: Single Insurance Status: Facilities managerrivate Insurance Receives non-medical governmental assistance (Work Engineer, agriculturalirst/Welfare, Sales executivefood stamps, etc.: No Lives with: Alone Living situation: House/Apartment  Reported Patient History: Patient reported health conditions: Anxiety disorders, Depression Patient aware of HIV and hepatitis status: No  In past year, has patient visited ED for any reason? No  Number of ED visits:    Reason(s) for visit:    In past year, has patient been hospitalized for any reason? No  Number of hospitalizations:    Reason(s) for hospitalization:    In past year, has patient been arrested? No  Number of arrests:    Reason(s) for arrest:    In past year, has patient been incarcerated? No  Number of incarcerations:    Reason(s) for incarceration:    In past year, has patient received medication-assisted treatment? No  In past year, patient received the following treatments: Other (comment)  In past year, has patient received any harm reduction services? No  Did this include any of the following?    In past year, has patient received care from a mental health provider for diagnosis other than SUD? No  In past year, is this first time patient has overdosed? No  Number of past overdoses:    In past year, is this first time patient has been hospitalized for an overdose? No  Number of hospitalizations for overdose(s):    Is patient currently receiving treatment for a mental health diagnosis?  No  Patient reports experiencing difficulty participating in SUD treatment: No    Most important reason(s) for this difficulty?    Has patient received prior services for treatment? No  In past, patient has received services from following agencies:    Plan of Care:  Suggested follow up at these agencies/treatment centers: (Pt stated he did not want any services from Peer)  Other information: [facility] was able to speak with Pt about how he is doing and to gain information on what services Pt wanted at the time. [facility] was made aware that Pt does not want any services from Peer at the time. CPSS gave Pt contact information for when or if he gets ready for help.   Arlys JohnBrian Markhi Kleckner, CPSS  06/28/2017 11:32 AM

## 2017-06-28 NOTE — Consult Note (Signed)
[facility] Consult   Reason for Consult:  Severe anxiety, alcohol abuse Referring Physician:  EDP Patient Identification: Thomas Fitzpatrick MRN:  [id number] Principal Diagnosis: Alcohol abuse with alcohol-induced anxiety disorder [facility]) Diagnosis:   Patient Active Problem List   Diagnosis Date Noted  . Alcohol withdrawal delirium, acute, hyperactive ([place]) [F10.231] 06/28/2017  . Chest pain [R07.9] 06/28/2017  . Alcohol withdrawal ([place]) [F10.239] 06/28/2017  . Back pain [M54.9] 06/28/2017  . Alcohol abuse with alcohol-induced anxiety disorder ([place]) [F10.180] 06/28/2017    Total Time spent with patient: 45 minutes  Subjective:   Thomas Fitzpatrick is a 42 y.o. male patient admitted with chest pain.  HPI:  Patient who reports long history of anxiety disorder, ADHD and Alcohol abuse of 2 weeks duration. Patient reports that he came to the emergency room for chest but currently denies any chest pain. However, he states that he was prescribed Ativan, Xanax, Adderall by his previous doctor in [place] but has not been able to get any doctor to prescribe Xanax or Ativan since he moved to [place] over 3 months ago. As a result he has been drinking Alcohol to self medicate but insisted that he is not Alcoholic; ''I do not have problem with Alcohol''. Patient states that he has not drank Alcohol in a while. His urine toxicology is negative and blood alcohol level less than 10. He denies nausea, vomiting, no visible hand tremors and refused to be prescribed any other medication other than Ativan/Xanax for anxiety. He insisted that he was placed on Benzodiazepine since 2006 and needs to continue it. 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 Today, patient denies depression, psychosis, delusions but report mild anxiety.   Past Psychiatric History: as above  Risk to Self: Is patient at risk  for suicide?: No Risk to Others:   Prior Inpatient Therapy:   Prior Outpatient Therapy:    Past Medical History:  Past Medical History:  Diagnosis Date  . Diverticulitis    History reviewed. No pertinent surgical history. Family History:  Family History  Problem Relation Age of Onset  . CAD Other    Family Psychiatric  History:  Social History:  Social History   Substance and Sexual Activity  Alcohol Use Yes   Comment: daily     Social History   Substance and Sexual Activity  Drug Use No    Social History   Socioeconomic History  . Marital status: Single    Spouse name: Not on file  . Number of children: Not on file  . Years of education: Not on file  . Highest education level: Not on file  Occupational History  . Not on file  Social Needs  . Financial resource strain: Not on file  . Food insecurity:    Worry: Not on file    Inability: Not on file  . Transportation needs:    Medical: Not on file    Non-medical: Not on file  Tobacco Use  . Smoking status: Never Smoker  . Smokeless tobacco: Never Used  Substance and Sexual Activity  . Alcohol use: Yes    Comment: daily  . Drug use: No  . Sexual activity: Not on file  Lifestyle  . Physical activity:    Days per week: Not on file    Minutes per session: Not on file  . Stress: Not on file  Relationships  .  Social connections:    Talks on phone: Not on file    Gets together: Not on file    Attends religious service: Not on file    Active member of club or organization: Not on file    Attends meetings of clubs or organizations: Not on file    Relationship status: Not on file  Other Topics Concern  . Not on file  Social History Narrative  . Not on file   Additional Social History:    Allergies:   Allergies  Allergen Reactions  . Penicillins     Labs:  Results for orders placed or performed during the hospital encounter of 06/27/17 (from the past 48 hour(s))  Comprehensive metabolic panel      Status: Abnormal   Collection Time: 06/27/17  8:26 PM  Result Value Ref Range   Sodium 142 135 - 145 mmol/L   Potassium 4.1 3.5 - 5.1 mmol/L   Chloride 110 101 - 111 mmol/L   CO2 23 22 - 32 mmol/L   Glucose, Bld 126 (H) 65 - 99 mg/dL   BUN 16 6 - 20 mg/dL   Creatinine, Ser 0.84 0.61 - 1.24 mg/dL   Calcium 9.0 8.9 - 10.3 mg/dL   Total Protein 7.5 6.5 - 8.1 g/dL   Albumin 4.1 3.5 - 5.0 g/dL   AST 24 15 - 41 U/L   ALT 31 17 - 63 U/L   Alkaline Phosphatase 79 38 - 126 U/L   Total Bilirubin 0.5 0.3 - 1.2 mg/dL   GFR calc non Af Amer >60 >60 mL/min   GFR calc Af Amer >60 >60 mL/min    Comment: (NOTE) The eGFR has been calculated using the CKD EPI equation. This calculation has not been validated in all clinical situations. eGFR's persistently <60 mL/min signify possible Chronic Kidney Disease.    Anion gap 9 5 - 15    Comment: Performed at [facility], [place] [address], [place], [place] [address]  Ethanol     Status: None   Collection Time: 06/27/17  8:26 PM  Result Value Ref Range   Alcohol, Ethyl (B) <10 <10 mg/dL    Comment:        LOWEST DETECTABLE LIMIT FOR SERUM ALCOHOL IS 10 mg/dL FOR MEDICAL PURPOSES ONLY Performed at [facility], [place] [address] [place], [place] [address]   cbc     Status: Abnormal   Collection Time: 06/27/17  8:26 PM  Result Value Ref Range   WBC 9.7 4.0 - 10.5 K/uL   RBC 5.55 4.22 - 5.81 MIL/uL   Hemoglobin 17.3 (H) 13.0 - 17.0 g/dL   HCT 49.5 39.0 - 52.0 %   MCV 89.2 78.0 - 100.0 fL   MCH 31.2 26.0 - 34.0 pg   MCHC 34.9 30.0 - 36.0 g/dL   RDW 12.5 11.5 - 15.5 %   Platelets 374 150 - 400 K/uL    Comment: Performed at [facility], [place] [address], [place], [place] [address]  Lipase, blood     Status: None   Collection Time: 06/27/17  8:26 PM  Result Value Ref Range   Lipase 29 11 - 51 U/L    Comment: Performed at [facility], [place] [address] [place], [place]  [address]  Magnesium     Status: None   Collection Time: 06/27/17  8:26 PM  Result Value Ref Range   Magnesium 2.1 1.7 - 2.4 mg/dL    Comment: Performed  at [facility], [place] [address] [place], [place] [address]  Phosphorus     Status: None   Collection Time: 06/27/17  8:26 PM  Result Value Ref Range   Phosphorus 3.1 2.5 - 4.6 mg/dL    Comment: Performed at [facility], [place] [address] [place], [place] [address]  Rapid urine drug screen (hospital performed)     Status: None   Collection Time: 06/27/17  9:58 PM  Result Value Ref Range   Opiates NONE DETECTED NONE DETECTED   Cocaine NONE DETECTED NONE DETECTED   Benzodiazepines NONE DETECTED NONE DETECTED   Amphetamines NONE DETECTED NONE DETECTED   Tetrahydrocannabinol NONE DETECTED NONE DETECTED   Barbiturates NONE DETECTED NONE DETECTED    Comment: (NOTE) DRUG SCREEN FOR MEDICAL PURPOSES ONLY.  IF CONFIRMATION IS NEEDED FOR ANY PURPOSE, NOTIFY LAB WITHIN 5 DAYS. LOWEST DETECTABLE LIMITS FOR URINE DRUG SCREEN Drug Class                     Cutoff (ng/mL) Amphetamine and metabolites    1000 Barbiturate and metabolites    200 Benzodiazepine                 778 Tricyclics and metabolites     300 Opiates and metabolites        300 Cocaine and metabolites        300 THC                            50 Performed at [facility], [place] [address] [place], [place] [address]   I-stat troponin, ED     Status: None   Collection Time: 06/27/17 10:34 PM  Result Value Ref Range   Troponin i, poc 0.00 0.00 - 0.08 ng/mL   Comment 3            Comment: Due to the release kinetics of cTnI, a negative result within the first hours of the onset of symptoms does not rule out myocardial infarction with certainty. If myocardial infarction is still suspected, repeat the test at appropriate intervals.     Current Facility-Administered Medications  Medication Dose Route Frequency Provider  Last Rate Last Dose  . gabapentin (NEURONTIN) capsule 300 mg  300 mg Oral TID Patrecia Pour, NP   300 mg at 06/28/17 1116  . sucralfate (CARAFATE) 1 GM/10ML suspension 1 g  1 g Oral TID WC & HS Doutova, Anastassia, MD   1 g at 06/28/17 1118  . traZODone (DESYREL) tablet 100 mg  100 mg Oral QHS Corena Pilgrim, MD       Current Outpatient Medications  Medication Sig Dispense Refill  . acetaminophen (TYLENOL) 500 MG tablet Take 1,000-1,500 mg by mouth 3 (three) times daily as needed (back pain/arthritis).    Marland Kitchen amphetamine-dextroamphetamine (ADDERALL) 15 MG tablet Take 15 mg by mouth 2 (two) times daily.     . ranitidine (ZANTAC) 75 MG tablet Take 75 mg by mouth at bedtime.    Marland Kitchen LORazepam (ATIVAN) 1 MG tablet Take 1 tablet (1 mg total) by mouth daily as needed for anxiety. (Patient not taking: Reported on 06/27/2017) 10 tablet 0    Musculoskeletal: Strength & Muscle Tone: within normal limits Gait & Station: normal Patient leans: N/A  Psychiatric Specialty Exam: Physical Exam  Psychiatric: He has a normal mood and affect. His speech is normal and behavior is normal. Judgment and thought  content normal. Cognition and memory are normal.    Review of Systems  Constitutional: Negative.   HENT: Negative.   Eyes: Negative.   Respiratory: Negative.   Cardiovascular: Negative.   Gastrointestinal: Negative.   Genitourinary: Negative.   Musculoskeletal: Negative.   Skin: Negative.   Neurological: Negative.   Endo/Heme/Allergies: Negative.   Psychiatric/Behavioral: Positive for substance abuse.    Blood pressure (!) 126/92, pulse 96, temperature 97.9 F (36.6 C), temperature source Oral, resp. rate 20, height _0  (1.88 m), weight 97 kg (213 lb 14.4 oz), SpO2 98 %.Body mass index is 27.46 kg/m.  General Appearance: Casual  Eye Contact:  Good  Speech:  Clear and Coherent  Volume:  Normal  Mood:  Euthymic  Affect:  Appropriate  Thought Process:  Coherent and Descriptions of  Associations: Intact  Orientation:  Full (Time, Place, and Person)  Thought Content:  Logical  Suicidal Thoughts:  No  Homicidal Thoughts:  No  Memory:  Immediate;   Good Recent;   Good Remote;   Good  Judgement:  Intact  Insight:  Fair  Psychomotor Activity:  Normal  Concentration:  Concentration: Good and Attention Span: Good  Recall:  Good  Fund of Knowledge:  Good  Language:  Good  Akathisia:  No  Handed:  Right  AIMS (if indicated):     Assets:  Communication Skills  ADL's:  Intact  Cognition:  WNL  Sleep:   fair     Treatment Plan Summary: Plan/Recommendation: -Cleared by psychiatric service -D/C Adderall due to chest pain and anxiety -Start Gabapentin 300 mg bid for anxiety/mood  Disposition: No evidence of imminent risk to self or others at present.   Patient does not meet criteria for psychiatric inpatient admission. Supportive therapy provided about ongoing stressors. Patient to follow up with his PCP at [facility], MD 06/28/2017 11:34 AM

## 2017-06-28 NOTE — ED Notes (Signed)
Pt sleeping soundly with even, unlabored resp. Will continue to monitor. VSS

## 2017-07-29 ENCOUNTER — Ambulatory Visit: Payer: BLUE CROSS/BLUE SHIELD | Admitting: Cardiology

## 2019-12-07 IMAGING — DX DG FOOT COMPLETE 3+V*L*
3 series · 3 of 3 positions shown · non-contrast
Comparison: None.

CLINICAL DATA: Stepped on piece of glass yesterday. Plantar foot
pain. Initial encounter.

EXAM:
LEFT FOOT - COMPLETE 3+ VIEW

[foot ap]
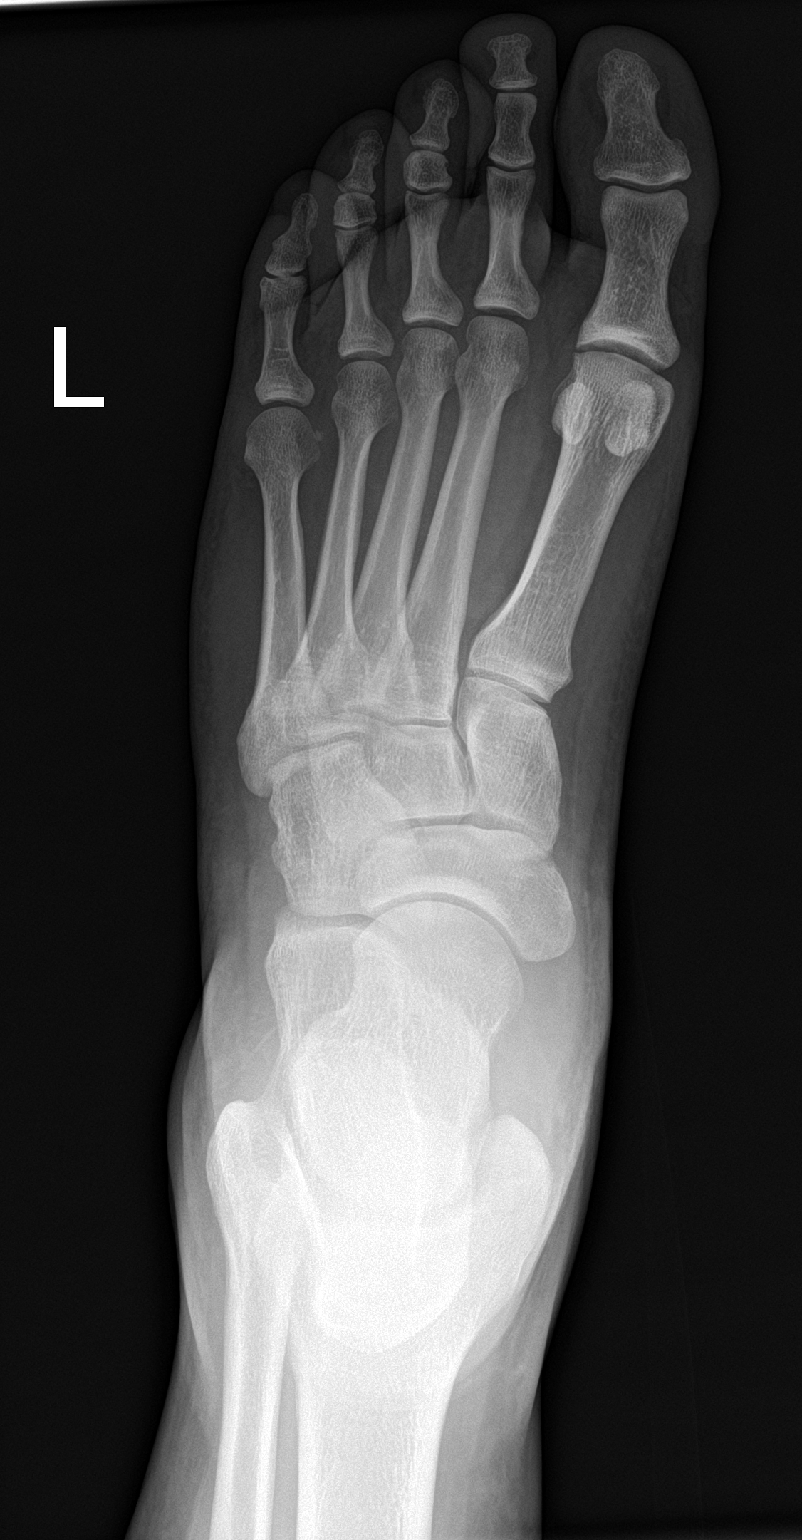

[foot obl]
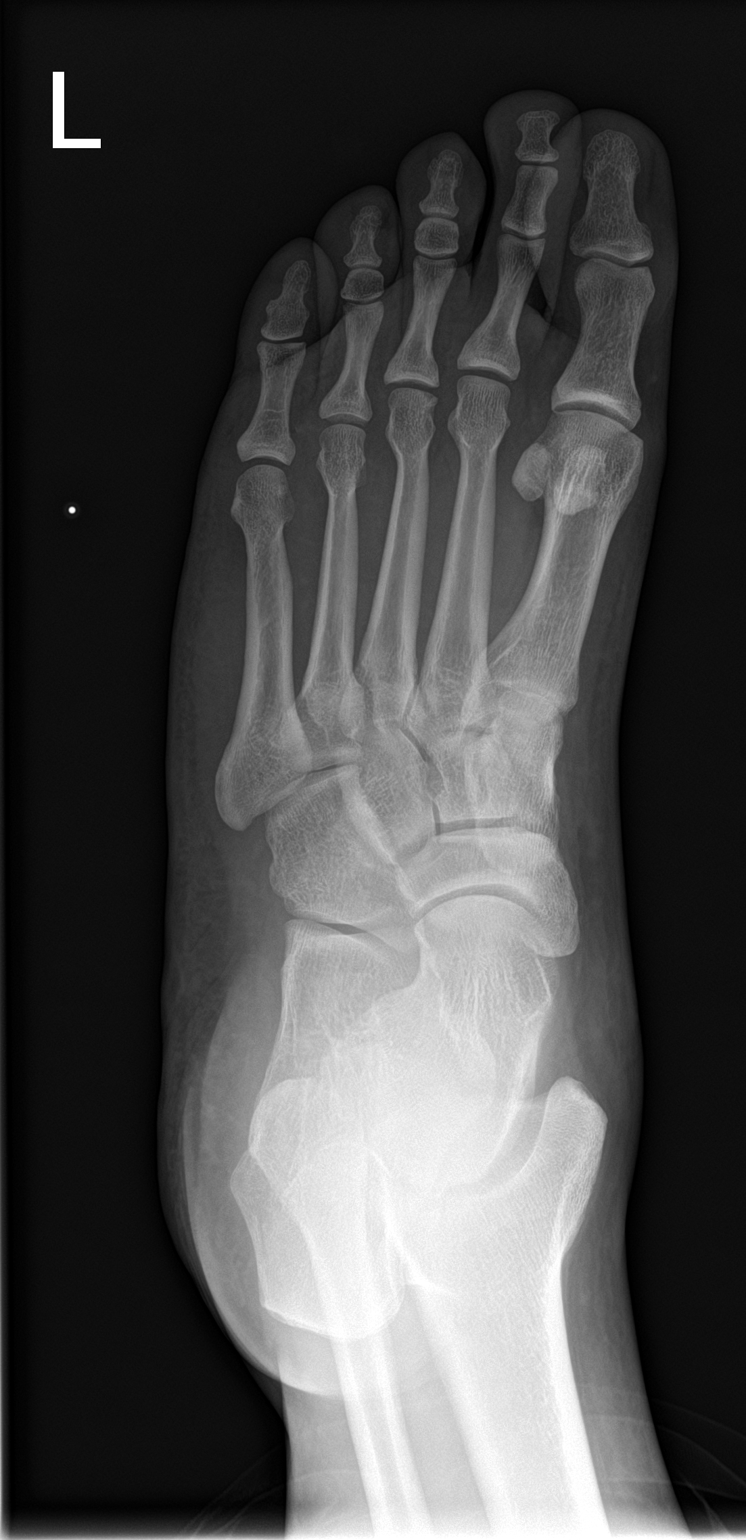

[foot lat]
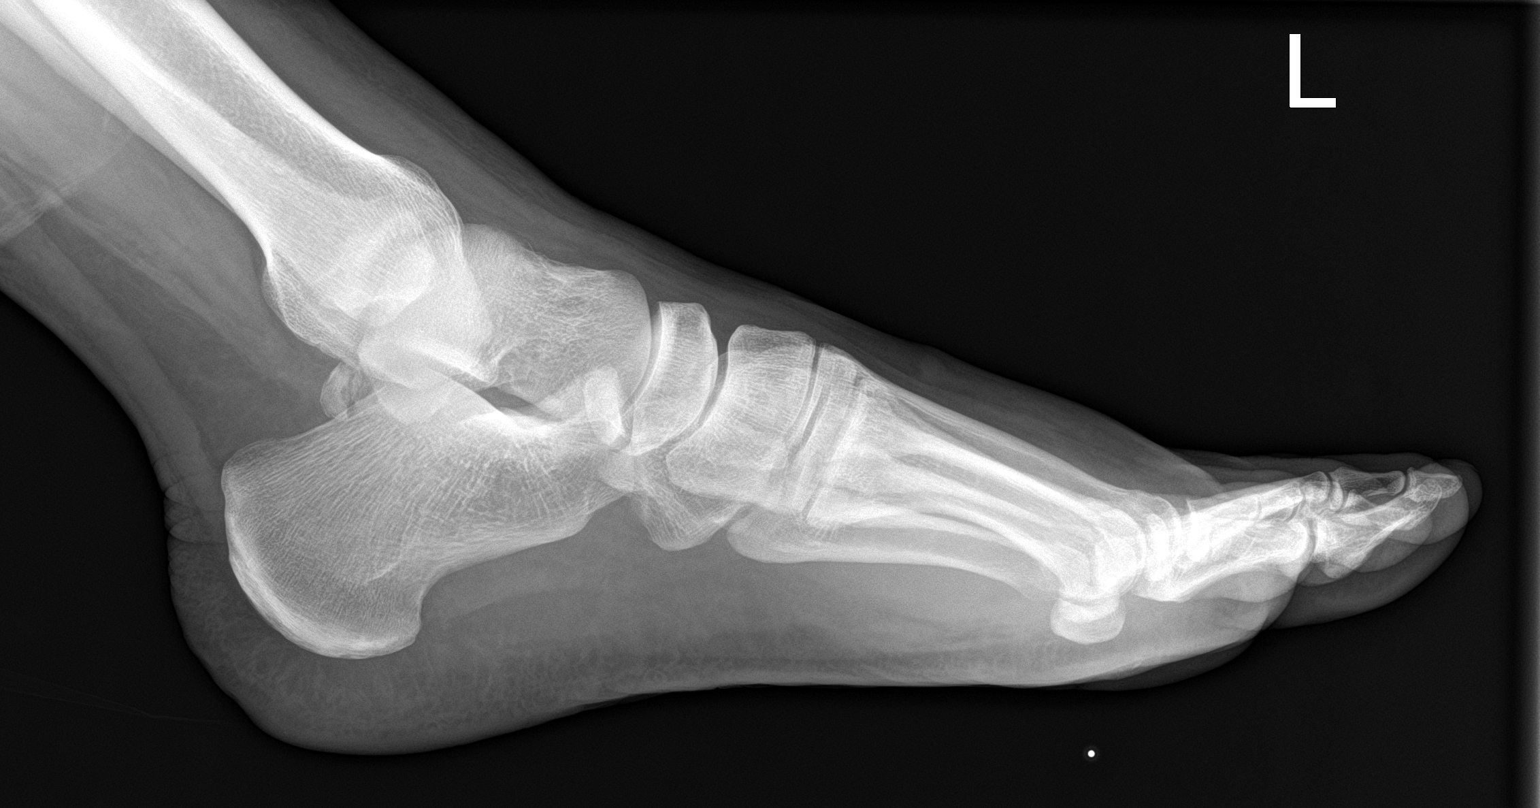

[3 of 3 positions shown; findings below may reference images not displayed]

FINDINGS: There is no evidence of fracture or dislocation. There is no
evidence of arthropathy or other focal bone abnormality. Soft
tissues are unremarkable. No radiopaque foreign body identified.
IMPRESSION: Negative.

## 2020-01-06 IMAGING — CR DG CHEST 2V
2 series · 2 of 2 positions shown · non-contrast
Comparison: None.

CLINICAL DATA: Chest pain.

EXAM:
CHEST - 2 VIEW

[w chest pa]
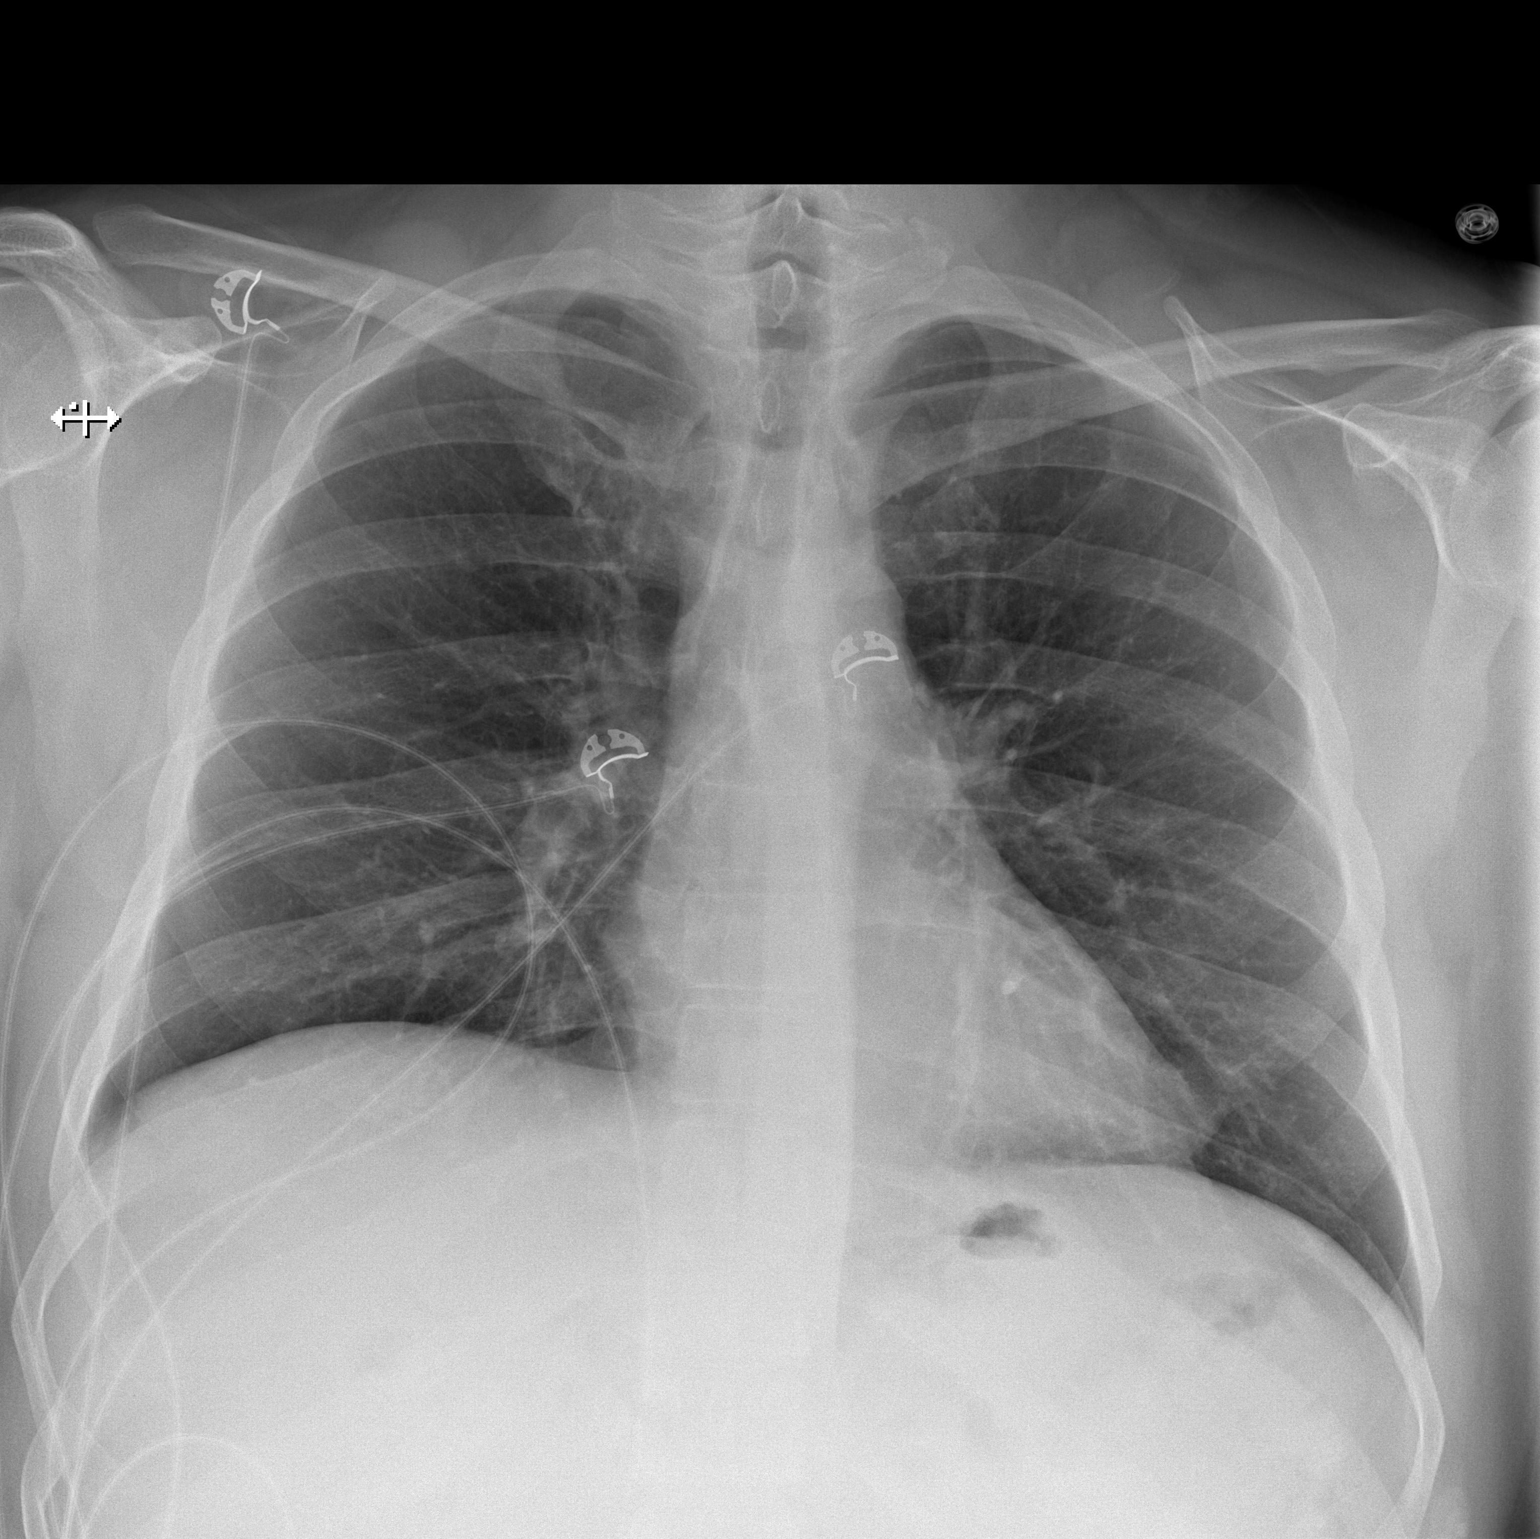

[w chest lat]
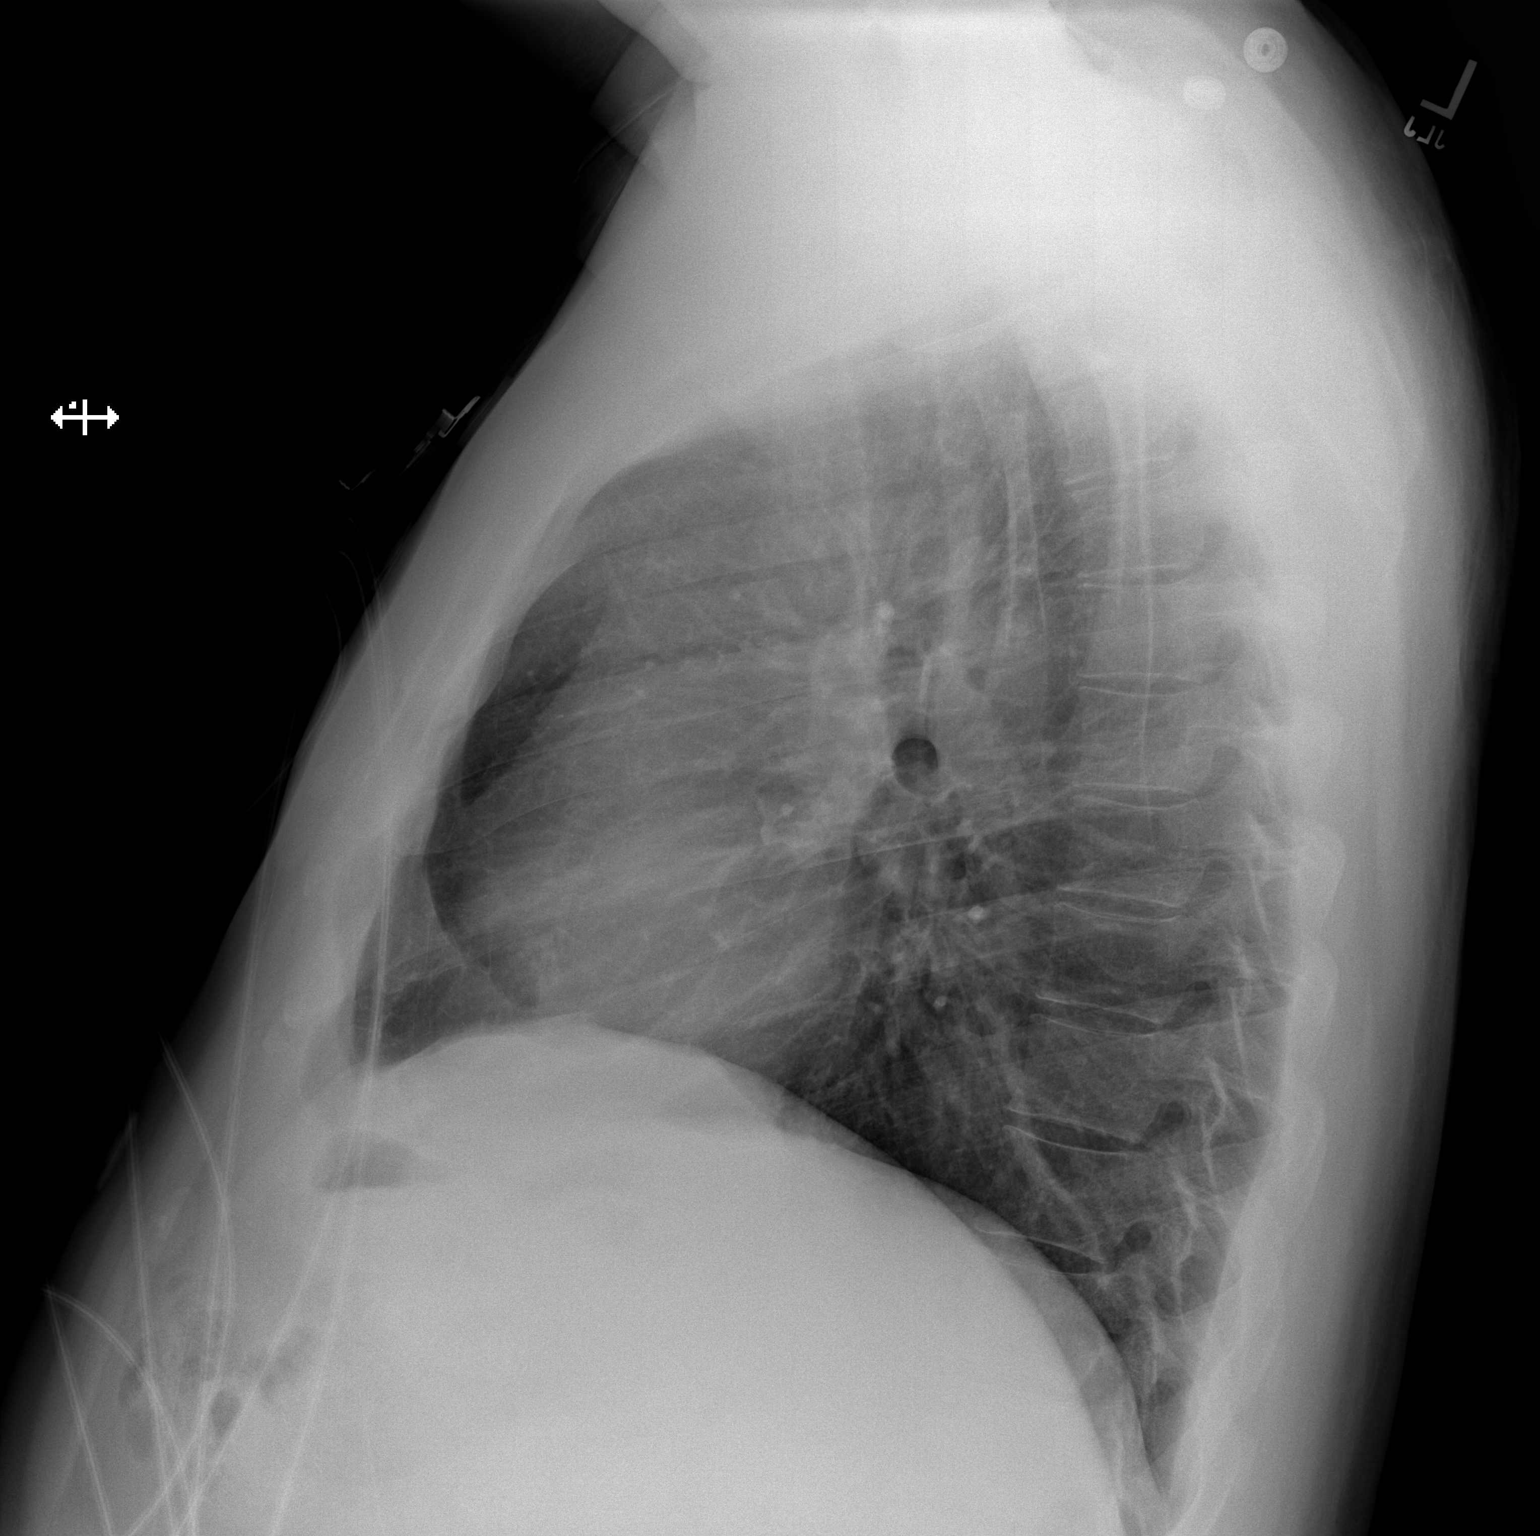

[2 of 2 positions shown; findings below may reference images not displayed]

FINDINGS: The cardiomediastinal contours are normal. The lungs are clear.
Pulmonary vasculature is normal. No consolidation, pleural effusion,
or pneumothorax. No acute osseous abnormalities are seen.
IMPRESSION: No acute pulmonary process.
